# Patient Record
Sex: Female | Born: 1990 | Race: Black or African American | Hispanic: No | Marital: Single | Smoking: Never smoker
Health system: Southern US, Community
[De-identification: ages and names within clinical notes are randomized; demographics above are authoritative.]

## PROBLEM LIST (undated history)

## (undated) ENCOUNTER — Inpatient Hospital Stay (EMERGENCY_DEPARTMENT_HOSPITAL): Payer: Medicaid Other

## (undated) ENCOUNTER — Inpatient Hospital Stay (HOSPITAL_COMMUNITY): Payer: Self-pay

## (undated) DIAGNOSIS — A749 Chlamydial infection, unspecified: Secondary | ICD-10-CM

## (undated) DIAGNOSIS — R87629 Unspecified abnormal cytological findings in specimens from vagina: Secondary | ICD-10-CM

## (undated) DIAGNOSIS — I10 Essential (primary) hypertension: Secondary | ICD-10-CM

## (undated) DIAGNOSIS — U071 COVID-19: Secondary | ICD-10-CM

---
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
## 2013-08-22 ENCOUNTER — Inpatient Hospital Stay (HOSPITAL_COMMUNITY)
Admission: AD | Admit: 2013-08-22 | Discharge: 2013-08-22 | Disposition: A | Discharge status: Home / Self Care | Payer: Self-pay | Source: Ambulatory Visit | Attending: Family Medicine | Admitting: Family Medicine

## 2013-08-22 ENCOUNTER — Encounter (HOSPITAL_COMMUNITY): Payer: Self-pay | Admitting: *Deleted

## 2013-08-22 ENCOUNTER — Inpatient Hospital Stay (HOSPITAL_COMMUNITY): Payer: Medicaid Other

## 2013-08-22 DIAGNOSIS — R109 Unspecified abdominal pain: Secondary | ICD-10-CM | POA: Insufficient documentation

## 2013-08-22 DIAGNOSIS — O926 Galactorrhea: Secondary | ICD-10-CM | POA: Insufficient documentation

## 2013-08-22 DIAGNOSIS — O0930 Supervision of pregnancy with insufficient antenatal care, unspecified trimester: Secondary | ICD-10-CM | POA: Insufficient documentation

## 2013-08-22 DIAGNOSIS — Z3201 Encounter for pregnancy test, result positive: Secondary | ICD-10-CM | POA: Insufficient documentation

## 2013-08-22 DIAGNOSIS — K5900 Constipation, unspecified: Secondary | ICD-10-CM | POA: Insufficient documentation

## 2013-08-22 DIAGNOSIS — N643 Galactorrhea not associated with childbirth: Secondary | ICD-10-CM

## 2013-08-22 LAB — CBC
HEMATOCRIT: 34.4 % — AB (ref 36.0–46.0)
Hemoglobin: 11.4 g/dL — ABNORMAL LOW (ref 12.0–15.0)
MCH: 29.6 pg (ref 26.0–34.0)
MCHC: 33.1 g/dL (ref 30.0–36.0)
MCV: 89.4 fL (ref 78.0–100.0)
PLATELETS: 159 10*3/uL (ref 150–400)
RBC: 3.85 MIL/uL — ABNORMAL LOW (ref 3.87–5.11)
RDW: 13.7 % (ref 11.5–15.5)
WBC: 6.9 10*3/uL (ref 4.0–10.5)

## 2013-08-22 LAB — DIFFERENTIAL
BASOS PCT: 0 % (ref 0–1)
Basophils Absolute: 0 10*3/uL (ref 0.0–0.1)
Eosinophils Absolute: 0 10*3/uL (ref 0.0–0.7)
Eosinophils Relative: 0 % (ref 0–5)
LYMPHS PCT: 24 % (ref 12–46)
Lymphs Abs: 1.7 10*3/uL (ref 0.7–4.0)
Monocytes Absolute: 0.4 10*3/uL (ref 0.1–1.0)
Monocytes Relative: 5 % (ref 3–12)
NEUTROS PCT: 71 % (ref 43–77)
Neutro Abs: 4.9 10*3/uL (ref 1.7–7.7)

## 2013-08-22 LAB — HEPATITIS B SURFACE ANTIGEN: HEP B S AG: NEGATIVE

## 2013-08-22 LAB — URINALYSIS, ROUTINE W REFLEX MICROSCOPIC
Bilirubin Urine: NEGATIVE
Glucose, UA: NEGATIVE mg/dL
Hgb urine dipstick: NEGATIVE
Ketones, ur: 15 mg/dL — AB
Nitrite: NEGATIVE
Protein, ur: NEGATIVE mg/dL
Specific Gravity, Urine: 1.02 (ref 1.005–1.030)
Urobilinogen, UA: 0.2 mg/dL (ref 0.0–1.0)
pH: 7 (ref 5.0–8.0)

## 2013-08-22 LAB — WET PREP, GENITAL
Clue Cells Wet Prep HPF POC: NONE SEEN
TRICH WET PREP: NONE SEEN
YEAST WET PREP: NONE SEEN

## 2013-08-22 LAB — URINE MICROSCOPIC-ADD ON

## 2013-08-22 LAB — OB RESULTS CONSOLE HIV ANTIBODY (ROUTINE TESTING): HIV: NONREACTIVE

## 2013-08-22 LAB — POCT PREGNANCY, URINE: Preg Test, Ur: POSITIVE — AB

## 2013-08-22 LAB — RAPID HIV SCREEN (WH-MAU): SUDS RAPID HIV SCREEN: NONREACTIVE

## 2013-08-22 LAB — RPR

## 2013-08-22 LAB — ABO/RH: ABO/RH(D): O POS

## 2013-08-22 NOTE — MAU Note (Addendum)
Been pretty sick last few wks, no longer nauseated.  Hasn't had a period since Sept, hx of irregular cycles.  Breasts started leaking- drips here and there past couple wks.  +HPT x2 yesterday.has been really constipated- giving her stomach cramps

## 2013-08-22 NOTE — MAU Provider Note (Signed)
History     CSN: [id number]  Arrival date and time: 08/22/13 1401   First Provider Initiated Contact with Patient 08/22/13 1550      Chief Complaint  Patient presents with  . Possible Pregnancy  . Abdominal Pain  . Breast Discharge    HPI  Monica Harding is a 23 y.o. female G1P0 unknown gestation who presents with breast leaking. She has noticed on occasion that both breast leak milk.  The patient took a pregnancy test yesterday and it was positive; she is pretty certain of her LMP which would make her approximately 34 weeks. The patient has no prenatal care. Following her positive pregnancy test the patient called the clinic and is scheduled for this Thursday.  She denies pain or bleeding at this time. At times she feels fetal movement; "feels like flutters".   OB History   Grav Para Term Preterm Abortions TAB SAB Ect Mult Living   1               History reviewed. No pertinent past medical history.  History reviewed. No pertinent past surgical history.  History reviewed. No pertinent family history.  History  Substance Use Topics  . Smoking status: Never Smoker   . Smokeless tobacco: Never Used  . Alcohol Use: Yes     Comment: social    Allergies: No Known Allergies  Prescriptions prior to admission  Medication Sig Dispense Refill  . acetaminophen (TYLENOL) 500 MG tablet Take 1,000 mg by mouth every 6 (six) hours as needed for headache.      . bisacodyl (DULCOLAX) 5 MG EC tablet Take 10 mg by mouth daily as needed for moderate constipation.      . calcium carbonate (TUMS - DOSED IN MG ELEMENTAL CALCIUM) 500 MG chewable tablet Chew 1 tablet by mouth daily as needed for heartburn.      . Calcium Carbonate Antacid (ROLAIDS EXTRA STRENGTH PO) Take 1 tablet by mouth daily as needed (used for heartburn).       Results for orders placed during the hospital encounter of 08/22/13 (from the past 48 hour(s))  URINALYSIS, ROUTINE W REFLEX MICROSCOPIC     Status:  Abnormal   Collection Time    08/22/13  2:25 PM      Result Value Ref Range   Color, Urine YELLOW  YELLOW   APPearance CLEAR  CLEAR   Specific Gravity, Urine 1.020  1.005 - 1.030   pH 7.0  5.0 - 8.0   Glucose, UA NEGATIVE  NEGATIVE mg/dL   Hgb urine dipstick NEGATIVE  NEGATIVE   Bilirubin Urine NEGATIVE  NEGATIVE   Ketones, ur 15 (*) NEGATIVE mg/dL   Protein, ur NEGATIVE  NEGATIVE mg/dL   Urobilinogen, UA 0.2  0.0 - 1.0 mg/dL   Nitrite NEGATIVE  NEGATIVE   Leukocytes, UA TRACE (*) NEGATIVE  URINE MICROSCOPIC-ADD ON     Status: Abnormal   Collection Time    08/22/13  2:25 PM      Result Value Ref Range   Squamous Epithelial / LPF FEW (*) RARE   WBC, UA 0-2  <3 WBC/hpf   Bacteria, UA FEW (*) RARE   Urine-Other MUCOUS PRESENT    POCT PREGNANCY, URINE     Status: Abnormal   Collection Time    08/22/13  2:28 PM      Result Value Ref Range   Preg Test, Ur POSITIVE (*) NEGATIVE   Comment:  THE SENSITIVITY OF THIS     METHODOLOGY IS >24 mIU/mL    Review of Systems  Constitutional: Negative for fever and chills.  Gastrointestinal: Positive for constipation (Small BM's daily ). Negative for nausea, vomiting, abdominal pain and diarrhea.  Genitourinary: Negative for dysuria, urgency, frequency and hematuria.       No vaginal discharge. No vaginal bleeding. No dysuria.    Physical Exam   Blood pressure 115/82, pulse 127, temperature 98.5 F (36.9 C), temperature source Oral, resp. rate 18, height 5\' 6"  (1.676 m), weight 120.657 kg (266 lb), last menstrual period 12/27/2012.  Physical Exam  Constitutional: She is oriented to person, place, and time. She appears well-developed and well-nourished.  Non-toxic appearance. She does not have a sickly appearance. She does not appear ill. No distress.  HENT:  Head: Normocephalic.  Eyes: Pupils are equal, round, and reactive to light.  Neck: Neck supple.  Respiratory: Effort normal. Right breast exhibits no nipple discharge.  Left breast exhibits no nipple discharge.  GI: Soft. She exhibits no distension. There is no tenderness. There is no rebound and no guarding.  Fundal Height 22-28 weeks   Genitourinary:  Speculum exam: Vagina - Small amount of creamy discharge, no odor Cervix - No contact bleeding GC/Chlam, wet prep done Chaperone present for exam.   Musculoskeletal: Normal range of motion.  Neurological: She is alert and oriented to person, place, and time.  Skin: Skin is warm. She is not diaphoretic.  Psychiatric: Her behavior is normal.    MAU Course  Procedures None  MDM +fht in MAU  Pt is [id number] based on US 3.58 cm cervical length on preliminary report Pt is scheduled in the clinic on Thursday and encouraged to keep that appointment  Prenatal labs drawn Wet pre GC-pending UA   Assessment and Plan   A:  1. Galactorrhea   2. Encounter for pregnancy test, result positive   3. No prenatal care in current pregnancy    P:  Discharge home in stable condition Return to MAU with any pain or bleeding Prenatal vitamins daily  Keep clinic appointment on Thursday Increase PO water intake   Iona HansenJennifer Irene Rasch, NP  08/22/2013, 8:02 PM

## 2013-08-22 NOTE — MAU Provider Note (Signed)
Attestation of Attending Supervision of Advanced Practitioner (PA/CNM/NP): Evaluation and management procedures were performed by the Advanced Practitioner under my supervision and collaboration.  I have reviewed the Advanced Practitioner's note and chart, and I agree with the management and plan.  Shamal Stracener S Tonishia Steffy, MD [facility] Attending 08/22/2013 9:08 PM   

## 2013-08-23 LAB — GC/CHLAMYDIA PROBE AMP
CT PROBE, AMP APTIMA: NEGATIVE
GC Probe RNA: NEGATIVE

## 2013-08-23 LAB — RUBELLA SCREEN: RUBELLA: 3.02 {index} — AB (ref <0.90)

## 2013-08-24 ENCOUNTER — Ambulatory Visit (INDEPENDENT_AMBULATORY_CARE_PROVIDER_SITE_OTHER): Payer: Self-pay

## 2013-08-24 DIAGNOSIS — N926 Irregular menstruation, unspecified: Secondary | ICD-10-CM

## 2013-08-24 LAB — POCT PREGNANCY, URINE: Preg Test, Ur: POSITIVE — AB

## 2013-08-24 NOTE — Progress Notes (Signed)
Patient had scheduled appointment for pregnancy test today. Had been seen in MAU 08/22/13 and pregnancy was confirmed by ultrasound. Patient does not need today's appointment or pregnancy test. All lab work is up to date. New OB appointment scheduled for 09/11/13.

## 2013-09-11 ENCOUNTER — Ambulatory Visit (INDEPENDENT_AMBULATORY_CARE_PROVIDER_SITE_OTHER): Payer: Self-pay | Admitting: Obstetrics and Gynecology

## 2013-09-11 ENCOUNTER — Encounter: Payer: Self-pay | Admitting: Obstetrics and Gynecology

## 2013-09-11 VITALS — BP 128/77 | HR 96 | Temp 98.2°F | Wt 269.8 lb

## 2013-09-11 DIAGNOSIS — O0933 Supervision of pregnancy with insufficient antenatal care, third trimester: Secondary | ICD-10-CM

## 2013-09-11 DIAGNOSIS — O0930 Supervision of pregnancy with insufficient antenatal care, unspecified trimester: Secondary | ICD-10-CM

## 2013-09-11 DIAGNOSIS — Z23 Encounter for immunization: Secondary | ICD-10-CM

## 2013-09-11 DIAGNOSIS — O99210 Obesity complicating pregnancy, unspecified trimester: Secondary | ICD-10-CM | POA: Insufficient documentation

## 2013-09-11 DIAGNOSIS — E669 Obesity, unspecified: Secondary | ICD-10-CM

## 2013-09-11 LAB — CBC
HEMATOCRIT: 33.7 % — AB (ref 36.0–46.0)
HEMOGLOBIN: 11.2 g/dL — AB (ref 12.0–15.0)
MCH: 28.6 pg (ref 26.0–34.0)
MCHC: 33.2 g/dL (ref 30.0–36.0)
MCV: 86 fL (ref 78.0–100.0)
Platelets: 178 10*3/uL (ref 150–400)
RBC: 3.92 MIL/uL (ref 3.87–5.11)
RDW: 14.5 % (ref 11.5–15.5)
WBC: 6.8 10*3/uL (ref 4.0–10.5)

## 2013-09-11 LAB — POCT URINALYSIS DIP (DEVICE)
Bilirubin Urine: NEGATIVE
Glucose, UA: NEGATIVE mg/dL
HGB URINE DIPSTICK: NEGATIVE
Ketones, ur: NEGATIVE mg/dL
Nitrite: NEGATIVE
PH: 5.5 (ref 5.0–8.0)
Protein, ur: NEGATIVE mg/dL
SPECIFIC GRAVITY, URINE: 1.025 (ref 1.005–1.030)
Urobilinogen, UA: 1 mg/dL (ref 0.0–1.0)

## 2013-09-11 MED ORDER — TETANUS-DIPHTH-ACELL PERTUSSIS 5-2.5-18.5 LF-MCG/0.5 IM SUSP: 0.5000 mL | Freq: Once | INTRAMUSCULAR | Status: DC

## 2013-09-11 MED ORDER — PANTOPRAZOLE SODIUM 40 MG PO TBEC: 40.0000 mg | DELAYED_RELEASE_TABLET | Freq: Every day | ORAL | Status: DC

## 2013-09-11 NOTE — Patient Instructions (Signed)
Third Trimester of Pregnancy  The third trimester is from week 29 through week 42, months 7 through 9. The third trimester is a time when the fetus is growing rapidly. At the end of the ninth month, the fetus is about 20 inches in length and weighs 6 10 pounds.   BODY CHANGES  Your body goes through many changes during pregnancy. The changes vary from woman to woman.    Your weight will continue to increase. You can expect to gain 25 35 pounds (11 16 kg) by the end of the pregnancy.   You may begin to get stretch marks on your hips, abdomen, and breasts.   You may urinate more often because the fetus is moving lower into your pelvis and pressing on your bladder.   You may develop or continue to have heartburn as a result of your pregnancy.   You may develop constipation because certain hormones are causing the muscles that push waste through your intestines to slow down.   You may develop hemorrhoids or swollen, bulging veins (varicose veins).   You may have pelvic pain because of the weight gain and pregnancy hormones relaxing your joints between the bones in your pelvis. Back aches may result from over exertion of the muscles supporting your posture.   Your breasts will continue to grow and be tender. A yellow discharge may leak from your breasts called colostrum.   Your belly button may stick out.   You may feel short of breath because of your expanding uterus.   You may notice the fetus "dropping," or moving lower in your abdomen.   You may have a bloody mucus discharge. This usually occurs a few days to a week before labor begins.   Your cervix becomes thin and soft (effaced) near your due date.  WHAT TO EXPECT AT YOUR PRENATAL EXAMS   You will have prenatal exams every 2 weeks until week 36. Then, you will have weekly prenatal exams. During a routine prenatal visit:   You will be weighed to make sure you and the fetus are growing normally.   Your blood pressure is taken.   Your abdomen will be  measured to track your baby's growth.   The fetal heartbeat will be listened to.   Any test results from the previous visit will be discussed.   You may have a cervical check near your due date to see if you have effaced.  At around 36 weeks, your caregiver will check your cervix. At the same time, your caregiver will also perform a test on the secretions of the vaginal tissue. This test is to determine if a type of bacteria, Group B streptococcus, is present. Your caregiver will explain this further.  Your caregiver may ask you:   What your birth plan is.   How you are feeling.   If you are feeling the baby move.   If you have had any abnormal symptoms, such as leaking fluid, bleeding, severe headaches, or abdominal cramping.   If you have any questions.  Other tests or screenings that may be performed during your third trimester include:   Blood tests that check for low iron levels (anemia).   Fetal testing to check the health, activity level, and growth of the fetus. Testing is done if you have certain medical conditions or if there are problems during the pregnancy.  FALSE LABOR  You may feel small, irregular contractions that eventually go away. These are called Braxton Hicks contractions, or   false labor. Contractions may last for hours, days, or even weeks before true labor sets in. If contractions come at regular intervals, intensify, or become painful, it is best to be seen by your caregiver.   SIGNS OF LABOR    Menstrual-like cramps.   Contractions that are 5 minutes apart or less.   Contractions that start on the top of the uterus and spread down to the lower abdomen and back.   A sense of increased pelvic pressure or back pain.   A watery or bloody mucus discharge that comes from the vagina.  If you have any of these signs before the 37th week of pregnancy, call your caregiver right away. You need to go to the hospital to get checked immediately.  HOME CARE INSTRUCTIONS    Avoid all  smoking, herbs, alcohol, and unprescribed drugs. These chemicals affect the formation and growth of the baby.   Follow your caregiver's instructions regarding medicine use. There are medicines that are either safe or unsafe to take during pregnancy.   Exercise only as directed by your caregiver. Experiencing uterine cramps is a good sign to stop exercising.   Continue to eat regular, healthy meals.   Wear a good support bra for breast tenderness.   Do not use hot tubs, steam rooms, or saunas.   Wear your seat belt at all times when driving.   Avoid raw meat, uncooked cheese, cat litter boxes, and soil used by cats. These carry germs that can cause birth defects in the baby.   Take your prenatal vitamins.   Try taking a stool softener (if your caregiver approves) if you develop constipation. Eat more high-fiber foods, such as fresh vegetables or fruit and whole grains. Drink plenty of fluids to keep your urine clear or pale yellow.   Take warm sitz baths to soothe any pain or discomfort caused by hemorrhoids. Use hemorrhoid cream if your caregiver approves.   If you develop varicose veins, wear support hose. Elevate your feet for 15 minutes, 3 4 times a day. Limit salt in your diet.   Avoid heavy lifting, wear low heal shoes, and practice good posture.   Rest a lot with your legs elevated if you have leg cramps or low back pain.   Visit your dentist if you have not gone during your pregnancy. Use a soft toothbrush to brush your teeth and be gentle when you floss.   A sexual relationship may be continued unless your caregiver directs you otherwise.   Do not travel far distances unless it is absolutely necessary and only with the approval of your caregiver.   Take prenatal classes to understand, practice, and ask questions about the labor and delivery.   Make a trial run to the hospital.   Pack your hospital bag.   Prepare the baby's nursery.   Continue to go to all your prenatal visits as directed  by your caregiver.  SEEK MEDICAL CARE IF:   You are unsure if you are in labor or if your water has broken.   You have dizziness.   You have mild pelvic cramps, pelvic pressure, or nagging pain in your abdominal area.   You have persistent nausea, vomiting, or diarrhea.   You have a bad smelling vaginal discharge.   You have pain with urination.  SEEK IMMEDIATE MEDICAL CARE IF:    You have a fever.   You are leaking fluid from your vagina.   You have spotting or bleeding from your vagina.     You have severe abdominal cramping or pain.   You have rapid weight loss or gain.   You have shortness of breath with chest pain.   You notice sudden or extreme swelling of your face, hands, ankles, feet, or legs.   You have not felt your baby move in over an hour.   You have severe headaches that do not go away with medicine.   You have vision changes.  Document Released: 03/17/2001 Document Revised: 11/23/2012 Document Reviewed: 05/24/2012  [employer] Patient Information 2014 [employer].

## 2013-09-11 NOTE — Progress Notes (Signed)
   Subjective:    Monica Harding is a G1P0 [redacted]w[redacted]d being seen today for her first obstetrical visit.  Her obstetrical history is significant for G1, unaware of pregnancy before 25 week, late [facility]. Patient does intend to breast feed. Pregnancy history fully reviewed.  Patient reports heartburn unresponsive to Rolaids.Ceasar Mons Vitals:   09/11/13 1358  BP: 128/77  Pulse: 96    HISTORY: OB History  Gravida Para Term Preterm AB SAB TAB Ectopic Multiple Living  1             # Outcome Date GA Lbr Len/2nd Weight Sex Delivery Anes PTL Lv  1 CUR              History reviewed. No pertinent past medical history. History reviewed. No pertinent past surgical history. History reviewed. No pertinent family history.   Exam    Uterus:     Pelvic Exam:    Perineum: Normal Perineum   Vulva: normal, Bartholin's, Urethra, Skene's normal   Vagina:  normal mucosa, normal discharge       Cervix: no bleeding following Pap and nulliparous appearance   Adnexa: not evaluated   Bony Pelvis: average  System: Breast:  normal appearance, no masses or tenderness   Skin: normal coloration and turgor, no rashes    Neurologic: oriented, grossly non-focal   Extremities: normal strength, tone, and muscle mass   HEENT PERRLA, extra ocular movement intact and thyroid without masses   Mouth/Teeth mucous membranes moist, pharynx normal without lesions and dental hygiene good   Neck supple and no masses   Cardiovascular: regular rate and rhythm, no murmurs or gallops   Respiratory:  appears well, vitals normal, no respiratory distress, acyanotic, normal RR, ear and throat exam is normal, neck free of mass or lymphadenopathy, chest clear, no wheezing, crepitations, rhonchi, normal symmetric air entry   Abdomen: soft, non-tender; bowel sounds normal; no masses,  no organomegaly   Urinary: urethral meatus normal      Assessment:    Pregnancy: G1P0 Patient Active Problem List   Diagnosis Date Noted  .  Supervision of high-risk pregnancy with insufficient prenatal care in third trimester 09/11/2013        Plan:     Initial labs drawn. Prenatal vitamins. Problem list reviewed and updated. Genetic Screening discussed Quad Screen: too late.  Ultrasound discussed; fetal survey: ordered.  Follow up in 2 weeks. 50% of 30 min visit spent on counseling and coordination of care.  Discussed diet, visit schedule, classes, danger signs.    Jennylee Uehara Colin Mulders 09/11/2013

## 2013-09-11 NOTE — Addendum Note (Signed)
Addended by: Kathee Delton on: 09/11/2013 03:09 PM   Modules accepted: Orders

## 2013-09-11 NOTE — Progress Notes (Signed)
Reports frequent heartburn and states rolaids does not help.  Edema in feet. C/o of intermittent lower back pain. Discussed appropriate weight gain (11-20lb); patient verbalized understanding.  New OB packets given.  1hr gtt and labs today as well as pap and cultures.

## 2013-09-12 LAB — GLUCOSE TOLERANCE, 1 HOUR (50G) W/O FASTING: Glucose, 1 Hour GTT: 106 mg/dL (ref 70–140)

## 2013-09-13 LAB — PRESCRIPTION MONITORING PROFILE (19 PANEL)
Amphetamine/Meth: NEGATIVE ng/mL
Barbiturate Screen, Urine: NEGATIVE ng/mL
Benzodiazepine Screen, Urine: NEGATIVE ng/mL
Buprenorphine, Urine: NEGATIVE ng/mL
COCAINE METABOLITES: NEGATIVE ng/mL
CREATININE, URINE: 189.58 mg/dL (ref >20.0)
Cannabinoid Scrn, Ur: NEGATIVE ng/mL
Carisoprodol, Urine: NEGATIVE ng/mL
ECSTASY: NEGATIVE ng/mL
FENTANYL URINE: NEGATIVE ng/mL
MEPERIDINE UR: NEGATIVE ng/mL
Methadone Screen, Urine: NEGATIVE ng/mL
Methaqualone: NEGATIVE ng/mL
Nitrites, Initial: NEGATIVE ug/mL
OXYCODONE SCRN UR: NEGATIVE ng/mL
Opiate Screen, Urine: NEGATIVE ng/mL
PH URINE, INITIAL: 6.5 pH (ref 4.5–8.9)
Phencyclidine, Ur: NEGATIVE ng/mL
Propoxyphene: NEGATIVE ng/mL
TRAMADOL UR: NEGATIVE ng/mL
Tapentadol, urine: NEGATIVE ng/mL
ZOLPIDEM, URINE: NEGATIVE ng/mL

## 2013-09-13 LAB — CULTURE, OB URINE

## 2013-09-13 LAB — CYTOLOGY - PAP

## 2013-09-22 ENCOUNTER — Ambulatory Visit (HOSPITAL_COMMUNITY)
Admission: RE | Admit: 2013-09-22 | Discharge: 2013-09-22 | Disposition: A | Discharge status: Home / Self Care | Payer: Medicaid Other | Source: Ambulatory Visit | Attending: Obstetrics and Gynecology | Admitting: Obstetrics and Gynecology

## 2013-09-22 DIAGNOSIS — O99210 Obesity complicating pregnancy, unspecified trimester: Secondary | ICD-10-CM | POA: Diagnosis present

## 2013-09-22 DIAGNOSIS — R9389 Abnormal findings on diagnostic imaging of other specified body structures: Secondary | ICD-10-CM

## 2013-09-22 DIAGNOSIS — Z3689 Encounter for other specified antenatal screening: Secondary | ICD-10-CM | POA: Diagnosis not present

## 2013-09-22 DIAGNOSIS — O0930 Supervision of pregnancy with insufficient antenatal care, unspecified trimester: Secondary | ICD-10-CM | POA: Diagnosis not present

## 2013-09-22 DIAGNOSIS — O0933 Supervision of pregnancy with insufficient antenatal care, third trimester: Secondary | ICD-10-CM

## 2013-09-22 DIAGNOSIS — E669 Obesity, unspecified: Secondary | ICD-10-CM | POA: Diagnosis not present

## 2013-09-27 ENCOUNTER — Ambulatory Visit (INDEPENDENT_AMBULATORY_CARE_PROVIDER_SITE_OTHER): Payer: Self-pay | Admitting: Advanced Practice Midwife

## 2013-09-27 VITALS — BP 136/89 | HR 100 | Temp 98.4°F | Wt 267.4 lb

## 2013-09-27 DIAGNOSIS — O0930 Supervision of pregnancy with insufficient antenatal care, unspecified trimester: Secondary | ICD-10-CM

## 2013-09-27 DIAGNOSIS — O0933 Supervision of pregnancy with insufficient antenatal care, third trimester: Secondary | ICD-10-CM

## 2013-09-27 LAB — POCT URINALYSIS DIP (DEVICE)
BILIRUBIN URINE: NEGATIVE
GLUCOSE, UA: NEGATIVE mg/dL
KETONES UR: NEGATIVE mg/dL
Nitrite: NEGATIVE
Protein, ur: NEGATIVE mg/dL
SPECIFIC GRAVITY, URINE: 1.025 (ref 1.005–1.030)
Urobilinogen, UA: 0.2 mg/dL (ref 0.0–1.0)
pH: 6 (ref 5.0–8.0)

## 2013-09-27 MED ORDER — DOCUSATE SODIUM 100 MG PO CAPS: 100.0000 mg | ORAL_CAPSULE | Freq: Two times a day (BID) | ORAL | Status: DC | PRN

## 2013-09-27 NOTE — Patient Instructions (Signed)
High-Fiber Diet Fiber is found in fruits, vegetables, and grains. A high-fiber diet encourages the addition of more whole grains, legumes, fruits, and vegetables in your diet. The recommended amount of fiber for adult males is 38 g per day. For adult females, it is 25 g per day. Pregnant and lactating women should get 28 g of fiber per day. If you have a digestive or bowel problem, ask your caregiver for advice before adding high-fiber foods to your diet. Eat a variety of high-fiber foods instead of only a select few type of foods.  PURPOSE  To increase stool bulk.  To make bowel movements more regular to prevent constipation.  To lower cholesterol.  To prevent overeating. WHEN IS THIS DIET USED?  It may be used if you have constipation and hemorrhoids.  It may be used if you have uncomplicated diverticulosis (intestine condition) and irritable bowel syndrome.  It may be used if you need help with weight management.  It may be used if you want to add it to your diet as a protective measure against atherosclerosis, diabetes, and cancer. SOURCES OF FIBER  Whole-grain breads and cereals.  Fruits, such as apples, oranges, bananas, berries, prunes, and pears.  Vegetables, such as green peas, carrots, sweet potatoes, beets, broccoli, cabbage, spinach, and artichokes.  Legumes, such split peas, soy, lentils.  Almonds. FIBER CONTENT IN FOODS Starches and Grains / Dietary Fiber (g)  Raisin Bran, 1 cup/7 g  Cheerios, 1 cup / 3 g  Corn Flakes cereal, 1 cup / 0.7 g  Rice crispy treat cereal, 1 cup / 0.3 g  Instant oatmeal (cooked),  cup / 2 g  Frosted wheat cereal, 1 cup / 5.1 g  Brown, long-grain rice (cooked), 1 cup / 3.5 g  White, long-grain rice (cooked), 1 cup / 0.6 g  Enriched macaroni (cooked), 1 cup / 2.5 g Legumes / Dietary Fiber (g)  Baked beans (canned, plain, or vegetarian),  cup / 5.2 g  Kidney beans (canned),  cup / 6.8 g  Pinto beans (cooked),  cup /  5.5 g Breads and Crackers / Dietary Fiber (g)  Plain or honey graham crackers, 2 squares / 0.7 g  Saltine crackers, 3 squares / 0.3 g  Plain, salted pretzels, 10 pieces / 1.8 g  Whole-wheat bread, 1 slice / 1.9 g  White bread, 1 slice / 0.7 g  Raisin bread, 1 slice / 1.2 g  Plain bagel, 3 oz / 2 g  Flour tortilla, 1 oz / 0.9 g  Corn tortilla, 1 small / 1.5 g  Hamburger or hotdog bun, 1 small / 0.9 g Fruits / Dietary Fiber (g)  Apple with skin, 1 medium / 4.4 g  Sweetened applesauce,  cup / 1.5 g  Banana,  medium / 1.5 g  Grapes, 10 grapes / 0.4 g  Orange, 1 small / 2.3 g  Raisin, 1.5 oz / 1.6 g  Melon, 1 cup / 1.4 g Vegetables / Dietary Fiber (g)  Green beans (canned),  cup / 1.3 g  Carrots (cooked),  cup / 2.3 g  Broccoli (cooked),  cup / 2.8 g  Peas (cooked),  cup / 4.4 g  Mashed potatoes,  cup / 1.6 g  Lettuce, 1 cup / 0.5 g  Corn (canned),  cup / 1.6 g  Tomato,  cup / 1.1 g Document Released: 03/23/2005 Document Revised: 09/22/2011 Document Reviewed: 06/25/2011 Exit[employer] Patient Information 2015 [employer]. This information is not intended to replace advice given to  you by your health care Jaiel Saraceno. Make sure you discuss any questions you have with your health care Berwyn Bigley.  

## 2013-09-27 NOTE — Progress Notes (Signed)
Reports intermittent lower abdominal/pelvic/back pain.

## 2013-09-27 NOTE — Progress Notes (Signed)
Doing well.  Good fetal movement, denies vaginal bleeding, LOF, regular contractions.  Reports uncomfortable cramping 1-2x/day.  Also is having constipation/hemorrhoids.   Discussed increase water intake, high fiber diet.

## 2013-10-03 DIAGNOSIS — R9389 Abnormal findings on diagnostic imaging of other specified body structures: Secondary | ICD-10-CM | POA: Insufficient documentation

## 2013-10-13 ENCOUNTER — Encounter: Payer: Self-pay | Admitting: Obstetrics and Gynecology

## 2013-10-13 ENCOUNTER — Ambulatory Visit (INDEPENDENT_AMBULATORY_CARE_PROVIDER_SITE_OTHER): Payer: Medicaid Other | Admitting: Obstetrics and Gynecology

## 2013-10-13 VITALS — BP 99/68 | HR 86 | Wt 268.7 lb

## 2013-10-13 DIAGNOSIS — O0933 Supervision of pregnancy with insufficient antenatal care, third trimester: Secondary | ICD-10-CM

## 2013-10-13 DIAGNOSIS — O0930 Supervision of pregnancy with insufficient antenatal care, unspecified trimester: Secondary | ICD-10-CM

## 2013-10-13 NOTE — Progress Notes (Signed)
Patient reports that she is having contractions that wake her up out of her sleep.  Also reports having headaches.

## 2013-10-13 NOTE — Progress Notes (Signed)
Constant H/As throughout head, some relief w/ Tylenol 1000mg  per day. Stress with job and moving again tomorrow, not sleeping well. Advised relaxation and increase daily amount Tylenol.  Woke from sleep with painful UCs intermittently and some other similar episodes.  Scheduled F/U US interval growth.

## 2013-10-13 NOTE — Patient Instructions (Signed)

## 2013-10-16 ENCOUNTER — Other Ambulatory Visit: Payer: Self-pay | Admitting: Obstetrics and Gynecology

## 2013-10-16 ENCOUNTER — Ambulatory Visit (HOSPITAL_COMMUNITY)
Admission: RE | Admit: 2013-10-16 | Discharge: 2013-10-16 | Disposition: A | Discharge status: Home / Self Care | Payer: Medicaid Other | Source: Ambulatory Visit | Attending: Obstetrics and Gynecology | Admitting: Obstetrics and Gynecology

## 2013-10-16 DIAGNOSIS — O0930 Supervision of pregnancy with insufficient antenatal care, unspecified trimester: Secondary | ICD-10-CM | POA: Diagnosis not present

## 2013-10-16 DIAGNOSIS — O0933 Supervision of pregnancy with insufficient antenatal care, third trimester: Secondary | ICD-10-CM

## 2013-10-16 DIAGNOSIS — Z3689 Encounter for other specified antenatal screening: Secondary | ICD-10-CM | POA: Diagnosis not present

## 2013-10-16 LAB — POCT URINALYSIS DIP (DEVICE)
BILIRUBIN URINE: NEGATIVE
GLUCOSE, UA: NEGATIVE mg/dL
Hgb urine dipstick: NEGATIVE
Leukocytes, UA: NEGATIVE
Nitrite: NEGATIVE
Protein, ur: NEGATIVE mg/dL
Urobilinogen, UA: 0.2 mg/dL (ref 0.0–1.0)
pH: 5.5 (ref 5.0–8.0)

## 2013-11-02 ENCOUNTER — Ambulatory Visit (INDEPENDENT_AMBULATORY_CARE_PROVIDER_SITE_OTHER): Payer: Medicaid Other | Admitting: Obstetrics and Gynecology

## 2013-11-02 VITALS — BP 119/66 | HR 133 | Temp 98.2°F | Wt 273.4 lb

## 2013-11-02 DIAGNOSIS — O0933 Supervision of pregnancy with insufficient antenatal care, third trimester: Secondary | ICD-10-CM

## 2013-11-02 DIAGNOSIS — O0930 Supervision of pregnancy with insufficient antenatal care, unspecified trimester: Secondary | ICD-10-CM

## 2013-11-02 DIAGNOSIS — Z872 Personal history of diseases of the skin and subcutaneous tissue: Secondary | ICD-10-CM | POA: Insufficient documentation

## 2013-11-02 LAB — POCT URINALYSIS DIP (DEVICE)
GLUCOSE, UA: NEGATIVE mg/dL
Hgb urine dipstick: NEGATIVE
KETONES UR: 40 mg/dL — AB
Leukocytes, UA: NEGATIVE
Nitrite: NEGATIVE
PROTEIN: NEGATIVE mg/dL
SPECIFIC GRAVITY, URINE: 1.025 (ref 1.005–1.030)
Urobilinogen, UA: 0.2 mg/dL (ref 0.0–1.0)
pH: 6 (ref 5.0–8.0)

## 2013-11-02 LAB — OB RESULTS CONSOLE GC/CHLAMYDIA
Chlamydia: NEGATIVE
GC PROBE AMP, GENITAL: NEGATIVE

## 2013-11-02 LAB — OB RESULTS CONSOLE GBS: STREP GROUP B AG: NEGATIVE

## 2013-11-02 NOTE — Progress Notes (Signed)
Pressure- vaginal   Pain-"feels like a contraction"

## 2013-11-02 NOTE — Patient Instructions (Signed)
Third Trimester of Pregnancy The third trimester is from week 29 through week 42, months 7 through 9. The third trimester is a time when the fetus is growing rapidly. At the end of the ninth month, the fetus is about 20 inches in length and weighs 6-10 pounds.  BODY CHANGES Your body goes through many changes during pregnancy. The changes vary from woman to woman.   Your weight will continue to increase. You can expect to gain 25-35 pounds (11-16 kg) by the end of the pregnancy.  You may begin to get stretch marks on your hips, abdomen, and breasts.  You may urinate more often because the fetus is moving lower into your pelvis and pressing on your bladder.  You may develop or continue to have heartburn as a result of your pregnancy.  You may develop constipation because certain hormones are causing the muscles that push waste through your intestines to slow down.  You may develop hemorrhoids or swollen, bulging veins (varicose veins).  You may have pelvic pain because of the weight gain and pregnancy hormones relaxing your joints between the bones in your pelvis. Backaches may result from overexertion of the muscles supporting your posture.  You may have changes in your hair. These can include thickening of your hair, rapid growth, and changes in texture. Some women also have hair loss during or after pregnancy, or hair that feels dry or thin. Your hair will most likely return to normal after your baby is born.  Your breasts will continue to grow and be tender. A yellow discharge may leak from your breasts called colostrum.  Your belly button may stick out.  You may feel short of breath because of your expanding uterus.  You may notice the fetus "dropping," or moving lower in your abdomen.  You may have a bloody mucus discharge. This usually occurs a few days to a week before labor begins.  Your cervix becomes thin and soft (effaced) near your due date. WHAT TO EXPECT AT YOUR PRENATAL  EXAMS  You will have prenatal exams every 2 weeks until week 36. Then, you will have weekly prenatal exams. During a routine prenatal visit:  You will be weighed to make sure you and the fetus are growing normally.  Your blood pressure is taken.  Your abdomen will be measured to track your baby's growth.  The fetal heartbeat will be listened to.  Any test results from the previous visit will be discussed.  You may have a cervical check near your due date to see if you have effaced. At around 36 weeks, your caregiver will check your cervix. At the same time, your caregiver will also perform a test on the secretions of the vaginal tissue. This test is to determine if a type of bacteria, Group B streptococcus, is present. Your caregiver will explain this further. Your caregiver may ask you:  What your birth plan is.  How you are feeling.  If you are feeling the baby move.  If you have had any abnormal symptoms, such as leaking fluid, bleeding, severe headaches, or abdominal cramping.  If you have any questions. Other tests or screenings that may be performed during your third trimester include:  Blood tests that check for low iron levels (anemia).  Fetal testing to check the health, activity level, and growth of the fetus. Testing is done if you have certain medical conditions or if there are problems during the pregnancy. FALSE LABOR You may feel small, irregular contractions that   eventually go away. These are called Braxton Hicks contractions, or false labor. Contractions may last for hours, days, or even weeks before true labor sets in. If contractions come at regular intervals, intensify, or become painful, it is best to be seen by your caregiver.  SIGNS OF LABOR   Menstrual-like cramps.  Contractions that are 5 minutes apart or less.  Contractions that start on the top of the uterus and spread down to the lower abdomen and back.  A sense of increased pelvic pressure or back  pain.  A watery or bloody mucus discharge that comes from the vagina. If you have any of these signs before the 37th week of pregnancy, call your caregiver right away. You need to go to the hospital to get checked immediately. HOME CARE INSTRUCTIONS   Avoid all smoking, herbs, alcohol, and unprescribed drugs. These chemicals affect the formation and growth of the baby.  Follow your caregiver's instructions regarding medicine use. There are medicines that are either safe or unsafe to take during pregnancy.  Exercise only as directed by your caregiver. Experiencing uterine cramps is a good sign to stop exercising.  Continue to eat regular, healthy meals.  Wear a good support bra for breast tenderness.  Do not use hot tubs, steam rooms, or saunas.  Wear your seat belt at all times when driving.  Avoid raw meat, uncooked cheese, cat litter boxes, and soil used by cats. These carry germs that can cause birth defects in the baby.  Take your prenatal vitamins.  Try taking a stool softener (if your caregiver approves) if you develop constipation. Eat more high-fiber foods, such as fresh vegetables or fruit and whole grains. Drink plenty of fluids to keep your urine clear or pale yellow.  Take warm sitz baths to soothe any pain or discomfort caused by hemorrhoids. Use hemorrhoid cream if your caregiver approves.  If you develop varicose veins, wear support hose. Elevate your feet for 15 minutes, 3-4 times a day. Limit salt in your diet.  Avoid heavy lifting, wear low heal shoes, and practice good posture.  Rest a lot with your legs elevated if you have leg cramps or low back pain.  Visit your dentist if you have not gone during your pregnancy. Use a soft toothbrush to brush your teeth and be gentle when you floss.  A sexual relationship may be continued unless your caregiver directs you otherwise.  Do not travel far distances unless it is absolutely necessary and only with the approval  of your caregiver.  Take prenatal classes to understand, practice, and ask questions about the labor and delivery.  Make a trial run to the hospital.  Pack your hospital bag.  Prepare the baby's nursery.  Continue to go to all your prenatal visits as directed by your caregiver. SEEK MEDICAL CARE IF:  You are unsure if you are in labor or if your water has broken.  You have dizziness.  You have mild pelvic cramps, pelvic pressure, or nagging pain in your abdominal area.  You have persistent nausea, vomiting, or diarrhea.  You have a bad smelling vaginal discharge.  You have pain with urination. SEEK IMMEDIATE MEDICAL CARE IF:   You have a fever.  You are leaking fluid from your vagina.  You have spotting or bleeding from your vagina.  You have severe abdominal cramping or pain.  You have rapid weight loss or gain.  You have shortness of breath with chest pain.  You notice sudden or extreme swelling   of your face, hands, ankles, feet, or legs.  You have not felt your baby move in over an hour.  You have severe headaches that do not go away with medicine.  You have vision changes. Document Released: 03/17/2001 Document Revised: 03/28/2013 Document Reviewed: 05/24/2012 [facility] Patient Information 2015 [place], [place]. This information is not intended to replace advice given to you by your health care provider. Make sure you discuss any questions you have with your health care provider. Heartburn Heartburn is a painful, burning sensation in the chest. It may feel worse in certain positions, such as lying down or bending over. It is caused by stomach acid backing up into the tube that carries food from the mouth down to the stomach (lower esophagus).  CAUSES   Large meals.  Certain foods and drinks.  Exercise.  Increased acid production.  Being overweight or obese.  Certain medicines. SYMPTOMS   Burning pain in the chest or lower throat.  Bitter taste in  the mouth.  Coughing. DIAGNOSIS  If the usual treatments for heartburn do not improve your symptoms, then tests may be done to see if there is another condition present. Possible tests may include:  X-rays.  Endoscopy. This is when a tube with a light and a camera on the end is used to examine the esophagus and the stomach.  A test to measure the amount of acid in the esophagus (pH test).  A test to see if the esophagus is working properly (esophageal manometry).  Blood, breath, or stool tests to check for bacteria that cause ulcers. TREATMENT   Your caregiver may tell you to use certain over-the-counter medicines (antacids, acid reducers) for mild heartburn.  Your caregiver may prescribe medicines to decrease the acid in your stomach or protect your stomach lining.  Your caregiver may recommend certain diet changes.  For severe cases, your caregiver may recommend that the head of your bed be elevated on blocks. (Sleeping with more pillows is not an effective treatment as it only changes the position of your head and does not improve the main problem of stomach acid refluxing into the esophagus.) HOME CARE INSTRUCTIONS   Take all medicines as directed by your caregiver.  Raise the head of your bed by putting blocks under the legs if instructed to by your caregiver.  Do not exercise right after eating.  Avoid eating 2 or 3 hours before bed. Do not lie down right after eating.  Eat small meals throughout the day instead of 3 large meals.  Stop smoking if you smoke.  Maintain a healthy weight.  Identify foods and beverages that make your symptoms worse and avoid them. Foods you may want to avoid include:  Peppers.  Chocolate.  High-fat foods, including fried foods.  Spicy foods.  Garlic and onions.  Citrus fruits, including oranges, grapefruit, lemons, and limes.  Food containing tomatoes or tomato products.  Mint.  Carbonated drinks, caffeinated drinks, and  alcohol.  Vinegar. SEEK IMMEDIATE MEDICAL CARE IF:  You have severe chest pain that goes down your arm or into your jaw or neck.  You feel sweaty, dizzy, or lightheaded.  You are short of breath.  You vomit blood.  You have difficulty or pain with swallowing.  You have bloody or black, tarry stools.  You have episodes of heartburn more than 3 times a week for more than 2 weeks. MAKE SURE YOU:  Understand these instructions.  Will watch your condition.  Will get help right away if you  are not doing well or get worse. Document Released: 08/09/2008 Document Revised: 06/15/2011 Document Reviewed: 09/07/2010 [facility] Patient Information 2015 [place], [place]. This information is not intended to replace advice given to you by your health care provider. Make sure you discuss any questions you have with your health care provider.

## 2013-11-02 NOTE — Progress Notes (Signed)
Repeat pulse 108. States tired and sometimes hot and SOB, esp after working third shift. Good FM. US 34th%ile, nl AFI; AC lag, F/U US scheduled. S/Sx labor reviewed.  Forms keloids

## 2013-11-03 LAB — GC/CHLAMYDIA PROBE AMP
CT PROBE, AMP APTIMA: NEGATIVE
GC PROBE AMP APTIMA: NEGATIVE

## 2013-11-04 LAB — CULTURE, BETA STREP (GROUP B ONLY)

## 2013-11-09 ENCOUNTER — Ambulatory Visit (INDEPENDENT_AMBULATORY_CARE_PROVIDER_SITE_OTHER): Payer: Medicaid Other | Admitting: Obstetrics and Gynecology

## 2013-11-09 ENCOUNTER — Encounter: Payer: Self-pay | Admitting: Obstetrics and Gynecology

## 2013-11-09 VITALS — BP 124/71 | HR 104 | Temp 98.0°F | Wt 270.7 lb

## 2013-11-09 DIAGNOSIS — O0933 Supervision of pregnancy with insufficient antenatal care, third trimester: Secondary | ICD-10-CM

## 2013-11-09 DIAGNOSIS — O0930 Supervision of pregnancy with insufficient antenatal care, unspecified trimester: Secondary | ICD-10-CM

## 2013-11-09 LAB — POCT URINALYSIS DIP (DEVICE)
Bilirubin Urine: NEGATIVE
GLUCOSE, UA: NEGATIVE mg/dL
Hgb urine dipstick: NEGATIVE
KETONES UR: NEGATIVE mg/dL
Leukocytes, UA: NEGATIVE
Nitrite: NEGATIVE
Protein, ur: NEGATIVE mg/dL
SPECIFIC GRAVITY, URINE: 1.02 (ref 1.005–1.030)
Urobilinogen, UA: 1 mg/dL (ref 0.0–1.0)
pH: 6 (ref 5.0–8.0)

## 2013-11-09 NOTE — Progress Notes (Signed)
Edema-feet   Pain-"crampy in stomach" Given pt info medication to take in pregnancy Pt states that she vomits a lot at night when she goes to bed

## 2013-11-09 NOTE — Patient Instructions (Signed)

## 2013-11-09 NOTE — Progress Notes (Signed)
US at 33 wks 38th, nl AFI, nl Doppplers> f/u growth is scheduled. Has nausea and occasional vomiting when overeats, then feels better. Recommend small frequenet meals, heartburn meds as needed. Has stopped working now.

## 2013-11-15 ENCOUNTER — Other Ambulatory Visit: Payer: Self-pay | Admitting: Obstetrics and Gynecology

## 2013-11-15 ENCOUNTER — Ambulatory Visit (INDEPENDENT_AMBULATORY_CARE_PROVIDER_SITE_OTHER): Payer: Medicaid Other | Admitting: Advanced Practice Midwife

## 2013-11-15 ENCOUNTER — Ambulatory Visit (HOSPITAL_COMMUNITY)
Admission: RE | Admit: 2013-11-15 | Discharge: 2013-11-15 | Disposition: A | Discharge status: Home / Self Care | Payer: Medicaid Other | Source: Ambulatory Visit | Attending: Obstetrics and Gynecology | Admitting: Obstetrics and Gynecology

## 2013-11-15 VITALS — BP 109/53 | HR 97 | Wt 270.6 lb

## 2013-11-15 DIAGNOSIS — O0930 Supervision of pregnancy with insufficient antenatal care, unspecified trimester: Secondary | ICD-10-CM

## 2013-11-15 DIAGNOSIS — R9389 Abnormal findings on diagnostic imaging of other specified body structures: Secondary | ICD-10-CM

## 2013-11-15 DIAGNOSIS — O365990 Maternal care for other known or suspected poor fetal growth, unspecified trimester, not applicable or unspecified: Secondary | ICD-10-CM | POA: Diagnosis not present

## 2013-11-15 DIAGNOSIS — O0933 Supervision of pregnancy with insufficient antenatal care, third trimester: Secondary | ICD-10-CM

## 2013-11-15 DIAGNOSIS — Z3689 Encounter for other specified antenatal screening: Secondary | ICD-10-CM | POA: Insufficient documentation

## 2013-11-15 DIAGNOSIS — Z364 Encounter for antenatal screening for fetal growth retardation: Secondary | ICD-10-CM

## 2013-11-15 LAB — POCT URINALYSIS DIP (DEVICE)
BILIRUBIN URINE: NEGATIVE
GLUCOSE, UA: NEGATIVE mg/dL
Hgb urine dipstick: NEGATIVE
KETONES UR: NEGATIVE mg/dL
LEUKOCYTES UA: NEGATIVE
NITRITE: NEGATIVE
PH: 6 (ref 5.0–8.0)
Protein, ur: NEGATIVE mg/dL
Specific Gravity, Urine: 1.015 (ref 1.005–1.030)
Urobilinogen, UA: 1 mg/dL (ref 0.0–1.0)

## 2013-11-15 NOTE — Progress Notes (Signed)
Having lots of braxton hicks contractions

## 2013-11-15 NOTE — Patient Instructions (Addendum)
Braxton Hicks Contractions Contractions of the uterus can occur throughout pregnancy. Contractions are not always a sign that you are in labor.  WHAT ARE BRAXTON HICKS CONTRACTIONS?  Contractions that occur before labor are called Braxton Hicks contractions, or false labor. Toward the end of pregnancy (32-34 weeks), these contractions can develop more often and may become more forceful. This is not true labor because these contractions do not result in opening (dilatation) and thinning of the cervix. They are sometimes difficult to tell apart from true labor because these contractions can be forceful and people have different pain tolerances. You should not feel embarrassed if you go to the hospital with false labor. Sometimes, the only way to tell if you are in true labor is for your health care provider to look for changes in the cervix. If there are no prenatal problems or other health problems associated with the pregnancy, it is completely safe to be sent home with false labor and await the onset of true labor. HOW CAN YOU TELL THE DIFFERENCE BETWEEN TRUE AND FALSE LABOR? False Labor  The contractions of false labor are usually shorter and not as hard as those of true labor.   The contractions are usually irregular.   The contractions are often felt in the front of the lower abdomen and in the groin.   The contractions may go away when you walk around or change positions while lying down.   The contractions get weaker and are shorter lasting as time goes on.   The contractions do not usually become progressively stronger, regular, and closer together as with true labor.  True Labor  Contractions in true labor last 30-70 seconds, become very regular, usually become more intense, and increase in frequency.   The contractions do not go away with walking.   The discomfort is usually felt in the top of the uterus and spreads to the lower abdomen and low back.   True labor can be  determined by your health care provider with an exam. This will show that the cervix is dilating and getting thinner.  WHAT TO REMEMBER  Keep up with your usual exercises and follow other instructions given by your health care provider.   Take medicines as directed by your health care provider.   Keep your regular prenatal appointments.   Eat and drink lightly if you think you are going into labor.   If Braxton Hicks contractions are making you uncomfortable:   Change your position from lying down or resting to walking, or from walking to resting.   Sit and rest in a tub of warm water.   Drink 2-3 glasses of water. Dehydration may cause these contractions.   Do slow and deep breathing several times an hour.  WHEN SHOULD I SEEK IMMEDIATE MEDICAL CARE? Seek immediate medical care if:  Your contractions become stronger, more regular, and closer together.   You have fluid leaking or gushing from your vagina.   You have a fever.   You pass blood-tinged mucus.   You have vaginal bleeding.   You have continuous abdominal pain.   You have low back pain that you never had before.   You feel your baby's head pushing down and causing pelvic pressure.   Your baby is not moving as much as it used to.  Document Released: 03/23/2005 Document Revised: 03/28/2013 Document Reviewed: 01/02/2013 ExitCare Patient Information 2015 [employer]. This information is not intended to replace advice given to you by your health care   provider. Make sure you discuss any questions you have with your health care provider.   Contraception Choices Contraception (birth control) is the use of any methods or devices to prevent pregnancy. Below are some methods to help avoid pregnancy. HORMONAL METHODS   Contraceptive implant. This is a thin, plastic tube containing progesterone hormone. It does not contain estrogen hormone. Your health care provider inserts the tube in the inner part  of the upper arm. The tube can remain in place for up to 3 years. After 3 years, the implant must be removed. The implant prevents the ovaries from releasing an egg (ovulation), thickens the cervical mucus to prevent sperm from entering the uterus, and thins the lining of the inside of the uterus.  Progesterone-only injections. These injections are given every 3 months by your health care provider to prevent pregnancy. This synthetic progesterone hormone stops the ovaries from releasing eggs. It also thickens cervical mucus and changes the uterine lining. This makes it harder for sperm to survive in the uterus.  Birth control pills. These pills contain estrogen and progesterone hormone. They work by preventing the ovaries from releasing eggs (ovulation). They also cause the cervical mucus to thicken, preventing the sperm from entering the uterus. Birth control pills are prescribed by a health care provider.Birth control pills can also be used to treat heavy periods.  Minipill. This type of birth control pill contains only the progesterone hormone. They are taken every day of each month and must be prescribed by your health care provider.  Birth control patch. The patch contains hormones similar to those in birth control pills. It must be changed once a week and is prescribed by a health care provider.  Vaginal ring. The ring contains hormones similar to those in birth control pills. It is left in the vagina for 3 weeks, removed for 1 week, and then a new one is put back in place. The patient must be comfortable inserting and removing the ring from the vagina.A health care provider's prescription is necessary.  Emergency contraception. Emergency contraceptives prevent pregnancy after unprotected sexual intercourse. This pill can be taken right after sex or up to 5 days after unprotected sex. It is most effective the sooner you take the pills after having sexual intercourse. Most emergency contraceptive  pills are available without a prescription. Check with your pharmacist. Do not use emergency contraception as your only form of birth control. BARRIER METHODS   Female condom. This is a thin sheath (latex or rubber) that is worn over the penis during sexual intercourse. It can be used with spermicide to increase effectiveness.  Female condom. This is a soft, loose-fitting sheath that is put into the vagina before sexual intercourse.  Diaphragm. This is a soft, latex, dome-shaped barrier that must be fitted by a health care provider. It is inserted into the vagina, along with a spermicidal jelly. It is inserted before intercourse. The diaphragm should be left in the vagina for 6 to 8 hours after intercourse.  Cervical cap. This is a round, soft, latex or plastic cup that fits over the cervix and must be fitted by a health care provider. The cap can be left in place for up to 48 hours after intercourse.  Sponge. This is a soft, circular piece of polyurethane foam. The sponge has spermicide in it. It is inserted into the vagina after wetting it and before sexual intercourse.  Spermicides. These are chemicals that kill or block sperm from entering the cervix and uterus.   They come in the form of creams, jellies, suppositories, foam, or tablets. They do not require a prescription. They are inserted into the vagina with an applicator before having sexual intercourse. The process must be repeated every time you have sexual intercourse. INTRAUTERINE CONTRACEPTION  Intrauterine device (IUD). This is a T-shaped device that is put in a woman's uterus during a menstrual period to prevent pregnancy. There are 2 types:  Copper IUD. This type of IUD is wrapped in copper wire and is placed inside the uterus. Copper makes the uterus and fallopian tubes produce a fluid that kills sperm. It can stay in place for 10 years.  Hormone IUD. This type of IUD contains the hormone progestin (synthetic progesterone). The  hormone thickens the cervical mucus and prevents sperm from entering the uterus, and it also thins the uterine lining to prevent implantation of a fertilized egg. The hormone can weaken or kill the sperm that get into the uterus. It can stay in place for 3-5 years, depending on which type of IUD is used. PERMANENT METHODS OF CONTRACEPTION  Female tubal ligation. This is when the woman's fallopian tubes are surgically sealed, tied, or blocked to prevent the egg from traveling to the uterus.  Hysteroscopic sterilization. This involves placing a small coil or insert into each fallopian tube. Your doctor uses a technique called hysteroscopy to do the procedure. The device causes scar tissue to form. This results in permanent blockage of the fallopian tubes, so the sperm cannot fertilize the egg. It takes about 3 months after the procedure for the tubes to become blocked. You must use another form of birth control for these 3 months.  Female sterilization. This is when the female has the tubes that carry sperm tied off (vasectomy).This blocks sperm from entering the vagina during sexual intercourse. After the procedure, the man can still ejaculate fluid (semen). NATURAL PLANNING METHODS  Natural family planning. This is not having sexual intercourse or using a barrier method (condom, diaphragm, cervical cap) on days the woman could become pregnant.  Calendar method. This is keeping track of the length of each menstrual cycle and identifying when you are fertile.  Ovulation method. This is avoiding sexual intercourse during ovulation.  Symptothermal method. This is avoiding sexual intercourse during ovulation, using a thermometer and ovulation symptoms.  Post-ovulation method. This is timing sexual intercourse after you have ovulated. Regardless of which type or method of contraception you choose, it is important that you use condoms to protect against the transmission of sexually transmitted infections  (STIs). Talk with your health care provider about which form of contraception is most appropriate for you. Document Released: 03/23/2005 Document Revised: 03/28/2013 Document Reviewed: 09/15/2012 [facility] Patient Information 2015 [place], [place]. This information is not intended to replace advice given to you by your health care provider. Make sure you discuss any questions you have with your health care provider.  Fetal Movement Counts Patient Name: __________________________________________________ Patient Due Date: ____________________ Performing a fetal movement count is highly recommended in high-risk pregnancies, but it is good for every pregnant woman to do. Your health care provider may ask you to start counting fetal movements at 28 weeks of the pregnancy. Fetal movements often increase:  After eating a full meal.  After physical activity.  After eating or drinking something sweet or cold.  At rest. Pay attention to when you feel the baby is most active. This will help you notice a pattern of your baby's sleep and wake cycles and what factors  contribute to an increase in fetal movement. It is important to perform a fetal movement count at the same time each day when your baby is normally most active.  HOW TO COUNT FETAL MOVEMENTS 1. Find a quiet and comfortable area to sit or lie down on your left side. Lying on your left side provides the best blood and oxygen circulation to your baby. 2. Write down the day and time on a sheet of paper or in a journal. 3. Start counting kicks, flutters, swishes, rolls, or jabs in a 2-hour period. You should feel at least 10 movements within 2 hours. 4. If you do not feel 10 movements in 2 hours, wait 2-3 hours and count again. Look for a change in the pattern or not enough counts in 2 hours. SEEK MEDICAL CARE IF:  You feel less than 10 counts in 2 hours, tried twice.  There is no movement in over an hour.  The pattern is changing or taking longer  each day to reach 10 counts in 2 hours.  You feel the baby is not moving as he or she usually does. Date: ____________ Movements: ____________ Start time: ____________ Doreatha Martin time: ____________  Date: ____________ Movements: ____________ Start time: ____________ Doreatha Martin time: ____________ Date: ____________ Movements: ____________ Start time: ____________ Doreatha Martin time: ____________ Date: ____________ Movements: ____________ Start time: ____________ Doreatha Martin time: ____________ Date: ____________ Movements: ____________ Start time: ____________ Doreatha Martin time: ____________ Date: ____________ Movements: ____________ Start time: ____________ Doreatha Martin time: ____________ Date: ____________ Movements: ____________ Start time: ____________ Doreatha Martin time: ____________ Date: ____________ Movements: ____________ Start time: ____________ Doreatha Martin time: ____________  Date: ____________ Movements: ____________ Start time: ____________ Doreatha Martin time: ____________ Date: ____________ Movements: ____________ Start time: ____________ Doreatha Martin time: ____________ Date: ____________ Movements: ____________ Start time: ____________ Doreatha Martin time: ____________ Date: ____________ Movements: ____________ Start time: ____________ Doreatha Martin time: ____________ Date: ____________ Movements: ____________ Start time: ____________ Doreatha Martin time: ____________ Date: ____________ Movements: ____________ Start time: ____________ Doreatha Martin time: ____________ Date: ____________ Movements: ____________ Start time: ____________ Doreatha Martin time: ____________  Date: ____________ Movements: ____________ Start time: ____________ Doreatha Martin time: ____________ Date: ____________ Movements: ____________ Start time: ____________ Doreatha Martin time: ____________ Date: ____________ Movements: ____________ Start time: ____________ Doreatha Martin time: ____________ Date: ____________ Movements: ____________ Start time: ____________ Doreatha Martin time: ____________ Date: ____________ Movements:  ____________ Start time: ____________ Doreatha Martin time: ____________ Date: ____________ Movements: ____________ Start time: ____________ Doreatha Martin time: ____________ Date: ____________ Movements: ____________ Start time: ____________ Doreatha Martin time: ____________  Date: ____________ Movements: ____________ Start time: ____________ Doreatha Martin time: ____________ Date: ____________ Movements: ____________ Start time: ____________ Doreatha Martin time: ____________ Date: ____________ Movements: ____________ Start time: ____________ Doreatha Martin time: ____________ Date: ____________ Movements: ____________ Start time: ____________ Doreatha Martin time: ____________ Date: ____________ Movements: ____________ Start time: ____________ Doreatha Martin time: ____________ Date: ____________ Movements: ____________ Start time: ____________ Doreatha Martin time: ____________ Date: ____________ Movements: ____________ Start time: ____________ Doreatha Martin time: ____________  Date: ____________ Movements: ____________ Start time: ____________ Doreatha Martin time: ____________ Date: ____________ Movements: ____________ Start time: ____________ Doreatha Martin time: ____________ Date: ____________ Movements: ____________ Start time: ____________ Doreatha Martin time: ____________ Date: ____________ Movements: ____________ Start time: ____________ Doreatha Martin time: ____________ Date: ____________ Movements: ____________ Start time: ____________ Doreatha Martin time: ____________ Date: ____________ Movements: ____________ Start time: ____________ Doreatha Martin time: ____________ Date: ____________ Movements: ____________ Start time: ____________ Doreatha Martin time: ____________  Date: ____________ Movements: ____________ Start time: ____________ Doreatha Martin time: ____________ Date: ____________ Movements: ____________ Start time: ____________ Doreatha Martin time: ____________ Date: ____________ Movements: ____________ Start time: ____________ Doreatha Martin time: ____________ Date: ____________ Movements: ____________ Start time: ____________ Doreatha Martin  time:  ____________ Date: ____________ Movements: ____________ Start time: ____________ Doreatha Martin time: ____________ Date: ____________ Movements: ____________ Start time: ____________ Doreatha Martin time: ____________ Date: ____________ Movements: ____________ Start time: ____________ Doreatha Martin time: ____________  Date: ____________ Movements: ____________ Start time: ____________ Doreatha Martin time: ____________ Date: ____________ Movements: ____________ Start time: ____________ Doreatha Martin time: ____________ Date: ____________ Movements: ____________ Start time: ____________ Doreatha Martin time: ____________ Date: ____________ Movements: ____________ Start time: ____________ Doreatha Martin time: ____________ Date: ____________ Movements: ____________ Start time: ____________ Doreatha Martin time: ____________ Date: ____________ Movements: ____________ Start time: ____________ Doreatha Martin time: ____________ Date: ____________ Movements: ____________ Start time: ____________ Doreatha Martin time: ____________  Date: ____________ Movements: ____________ Start time: ____________ Doreatha Martin time: ____________ Date: ____________ Movements: ____________ Start time: ____________ Doreatha Martin time: ____________ Date: ____________ Movements: ____________ Start time: ____________ Doreatha Martin time: ____________ Date: ____________ Movements: ____________ Start time: ____________ Doreatha Martin time: ____________ Date: ____________ Movements: ____________ Start time: ____________ Doreatha Martin time: ____________ Date: ____________ Movements: ____________ Start time: ____________ Doreatha Martin time: ____________ Document Released: 04/22/2006 Document Revised: 08/07/2013 Document Reviewed: 01/18/2012 ExitCare Patient Information 2015 [place]. This information is not intended to replace advice given to you by your health care provider. Make sure you discuss any questions you have with your health care provider.

## 2013-12-01 ENCOUNTER — Ambulatory Visit (INDEPENDENT_AMBULATORY_CARE_PROVIDER_SITE_OTHER): Payer: Medicaid Other | Admitting: Obstetrics & Gynecology

## 2013-12-01 VITALS — BP 135/88 | HR 101 | Temp 98.6°F | Wt 274.0 lb

## 2013-12-01 DIAGNOSIS — O0930 Supervision of pregnancy with insufficient antenatal care, unspecified trimester: Secondary | ICD-10-CM

## 2013-12-01 DIAGNOSIS — O0933 Supervision of pregnancy with insufficient antenatal care, third trimester: Secondary | ICD-10-CM

## 2013-12-01 LAB — POCT URINALYSIS DIP (DEVICE)
Glucose, UA: NEGATIVE mg/dL
Hgb urine dipstick: NEGATIVE
Ketones, ur: 15 mg/dL — AB
LEUKOCYTES UA: NEGATIVE
NITRITE: NEGATIVE
PH: 5.5 (ref 5.0–8.0)
Protein, ur: NEGATIVE mg/dL
Specific Gravity, Urine: 1.025 (ref 1.005–1.030)
Urobilinogen, UA: 1 mg/dL (ref 0.0–1.0)

## 2013-12-01 NOTE — Patient Instructions (Signed)

## 2013-12-01 NOTE — Progress Notes (Signed)
Pressure, takes protonix and tums for reflux. Labor precautions given. [place] result 8/12 reviewed, nl dopplers and AFI and growth

## 2013-12-01 NOTE — Progress Notes (Signed)
Pain-pelvic   Pressure-pelvic/vaginal

## 2013-12-06 ENCOUNTER — Encounter (HOSPITAL_COMMUNITY): Payer: Self-pay

## 2013-12-06 ENCOUNTER — Inpatient Hospital Stay (HOSPITAL_COMMUNITY)
Admission: RE | Admit: 2013-12-06 | Discharge: 2013-12-11 | DRG: 765 | Disposition: A | Discharge status: Home / Self Care | Payer: Medicaid Other | Source: Ambulatory Visit | Attending: Obstetrics & Gynecology | Admitting: Obstetrics & Gynecology

## 2013-12-06 ENCOUNTER — Ambulatory Visit (INDEPENDENT_AMBULATORY_CARE_PROVIDER_SITE_OTHER): Payer: Medicaid Other | Admitting: Advanced Practice Midwife

## 2013-12-06 ENCOUNTER — Telehealth (HOSPITAL_COMMUNITY): Payer: Self-pay | Admitting: *Deleted

## 2013-12-06 ENCOUNTER — Encounter (HOSPITAL_COMMUNITY): Payer: Self-pay | Admitting: *Deleted

## 2013-12-06 VITALS — BP 120/90 | HR 102 | Temp 98.4°F | Wt 273.0 lb

## 2013-12-06 DIAGNOSIS — O99210 Obesity complicating pregnancy, unspecified trimester: Secondary | ICD-10-CM

## 2013-12-06 DIAGNOSIS — O0930 Supervision of pregnancy with insufficient antenatal care, unspecified trimester: Secondary | ICD-10-CM

## 2013-12-06 DIAGNOSIS — E669 Obesity, unspecified: Secondary | ICD-10-CM | POA: Diagnosis present

## 2013-12-06 DIAGNOSIS — Z6841 Body Mass Index (BMI) 40.0 and over, adult: Secondary | ICD-10-CM | POA: Diagnosis not present

## 2013-12-06 DIAGNOSIS — Z87891 Personal history of nicotine dependence: Secondary | ICD-10-CM

## 2013-12-06 DIAGNOSIS — O480 Post-term pregnancy: Secondary | ICD-10-CM | POA: Diagnosis present

## 2013-12-06 DIAGNOSIS — O99214 Obesity complicating childbirth: Secondary | ICD-10-CM

## 2013-12-06 DIAGNOSIS — O365990 Maternal care for other known or suspected poor fetal growth, unspecified trimester, not applicable or unspecified: Secondary | ICD-10-CM | POA: Diagnosis present

## 2013-12-06 DIAGNOSIS — O0933 Supervision of pregnancy with insufficient antenatal care, third trimester: Secondary | ICD-10-CM

## 2013-12-06 LAB — TYPE AND SCREEN
ABO/RH(D): O POS
Antibody Screen: NEGATIVE

## 2013-12-06 LAB — POCT URINALYSIS DIP (DEVICE)
Bilirubin Urine: NEGATIVE
GLUCOSE, UA: NEGATIVE mg/dL
Hgb urine dipstick: NEGATIVE
KETONES UR: NEGATIVE mg/dL
Leukocytes, UA: NEGATIVE
Nitrite: NEGATIVE
PROTEIN: NEGATIVE mg/dL
Specific Gravity, Urine: 1.015 (ref 1.005–1.030)
UROBILINOGEN UA: 0.2 mg/dL (ref 0.0–1.0)
pH: 7 (ref 5.0–8.0)

## 2013-12-06 LAB — CBC
HCT: 39.1 % (ref 36.0–46.0)
Hemoglobin: 12.9 g/dL (ref 12.0–15.0)
MCH: 30.1 pg (ref 26.0–34.0)
MCHC: 33 g/dL (ref 30.0–36.0)
MCV: 91.4 fL (ref 78.0–100.0)
Platelets: 136 10*3/uL — ABNORMAL LOW (ref 150–400)
RBC: 4.28 MIL/uL (ref 3.87–5.11)
RDW: 14.5 % (ref 11.5–15.5)
WBC: 6.5 10*3/uL (ref 4.0–10.5)

## 2013-12-06 MED ORDER — FENTANYL CITRATE 0.05 MG/ML IJ SOLN
100.0000 ug | INTRAMUSCULAR | Status: DC | PRN
  Administered 2013-12-07 – 2013-12-08 (×6): 100 ug via INTRAVENOUS
  Filled 2013-12-06 (×6): qty 2

## 2013-12-06 MED ORDER — LIDOCAINE HCL (PF) 1 % IJ SOLN: 30.0000 mL | INTRAMUSCULAR | Status: DC | PRN

## 2013-12-06 MED ORDER — LACTATED RINGERS IV SOLN: 500.0000 mL | INTRAVENOUS | Status: DC | PRN

## 2013-12-06 MED ORDER — FLEET ENEMA 7-19 GM/118ML RE ENEM: 1.0000 | ENEMA | RECTAL | Status: DC | PRN

## 2013-12-06 MED ORDER — LACTATED RINGERS IV SOLN
INTRAVENOUS | Status: DC
  Administered 2013-12-06 – 2013-12-09 (×10): via INTRAVENOUS

## 2013-12-06 MED ORDER — OXYTOCIN BOLUS FROM INFUSION: 500.0000 mL | INTRAVENOUS | Status: DC

## 2013-12-06 MED ORDER — ACETAMINOPHEN 325 MG PO TABS: 650.0000 mg | ORAL_TABLET | ORAL | Status: DC | PRN

## 2013-12-06 MED ORDER — OXYCODONE-ACETAMINOPHEN 5-325 MG PO TABS: 2.0000 | ORAL_TABLET | ORAL | Status: DC | PRN

## 2013-12-06 MED ORDER — ZOLPIDEM TARTRATE 5 MG PO TABS
5.0000 mg | ORAL_TABLET | Freq: Every evening | ORAL | Status: DC | PRN
  Administered 2013-12-07: 5 mg via ORAL
  Filled 2013-12-06: qty 1

## 2013-12-06 MED ORDER — ONDANSETRON HCL 4 MG/2ML IJ SOLN: 4.0000 mg | Freq: Four times a day (QID) | INTRAMUSCULAR | Status: DC | PRN

## 2013-12-06 MED ORDER — OXYCODONE-ACETAMINOPHEN 5-325 MG PO TABS: 1.0000 | ORAL_TABLET | ORAL | Status: DC | PRN

## 2013-12-06 MED ORDER — OXYTOCIN 40 UNITS IN LACTATED RINGERS INFUSION - SIMPLE MED: 62.5000 mL/h | INTRAVENOUS | Status: DC

## 2013-12-06 MED ORDER — PHENYLEPHRINE 40 MCG/ML (10ML) SYRINGE FOR IV PUSH (FOR BLOOD PRESSURE SUPPORT)
PREFILLED_SYRINGE | INTRAVENOUS | Status: DC
Start: 2013-12-06 — End: 2013-12-06
  Filled 2013-12-06: qty 10

## 2013-12-06 MED ORDER — CITRIC ACID-SODIUM CITRATE 334-500 MG/5ML PO SOLN
30.0000 mL | ORAL | Status: DC | PRN
  Administered 2013-12-09: 30 mL via ORAL
  Filled 2013-12-06: qty 15

## 2013-12-06 MED ORDER — TERBUTALINE SULFATE 1 MG/ML IJ SOLN: 0.2500 mg | Freq: Once | INTRAMUSCULAR | Status: AC | PRN

## 2013-12-06 MED ORDER — MISOPROSTOL 25 MCG QUARTER TABLET
25.0000 ug | ORAL_TABLET | ORAL | Status: DC
  Administered 2013-12-06 – 2013-12-07 (×3): 25 ug via VAGINAL
  Filled 2013-12-06 (×3): qty 0.25

## 2013-12-06 NOTE — H&P (Signed)
Monica Harding is a 23 y.o. female G1 @ 40.4wks by 25wk U/S presenting for IOL due to fetal AC lag noted on U/S @ 34wks & 37wks. She had a scan with nl AFI/dopplers @ 37wks showing 34%th EFW but persistent lagging AC, and it was recommended by MFM that she be induced tonight. Denies N/V/D; no leaking or bldg. Her preg has been followed by [facility] and has been remarkable for 1) late prenatal care starting at 28wks 2) obesity 3) AC lag in fetal growth History OB History   Grav Para Term Preterm Abortions TAB SAB Ect Mult Living   1              History reviewed. No pertinent past medical history. History reviewed. No pertinent past surgical history. Family History: family history is not on file. Social History:  reports that she has quit smoking. She has never used smokeless tobacco. She reports that she drinks alcohol. She reports that she does not use illicit drugs.   Prenatal Transfer Tool  Maternal Diabetes: No Genetic Screening: Declined- too late Maternal Ultrasounds/Referrals: Normal Fetal Ultrasounds or other Referrals:  Referred to Materal Fetal Medicine for [facility] growth lag noted Maternal Substance Abuse:  No Significant Maternal Medications:  None Significant Maternal Lab Results:  Lab values include: Group B Strep negative Other Comments:  None  ROS  Dilation: Closed Effacement (%): Thick Station: -3 Exam by:: [employer] Blood pressure 126/80, pulse 112, temperature 98.7 F (37.1 C), temperature source Oral, resp. rate 18, height  (1.727 m), weight 123.832 kg (273 lb), last menstrual period 12/27/2012. Exam Physical Exam  Constitutional: She is oriented to person, place, and time. She appears well-developed.  HENT:  Head: Normocephalic.  Neck: Normal range of motion.  GI:  EFM 140s, +accels, no decels, occ mi variables Rare ctx  Musculoskeletal: Normal range of motion.  Neurological: She is alert and oriented to person, place, and time.  Skin: Skin is warm and  dry.  Psychiatric: She has a normal mood and affect. Her behavior is normal. Thought content normal.    Prenatal labs: ABO, Rh: --/--/O POS (09/02 2105) Antibody: PENDING (09/02 2105) Rubella: 3.02 (05/19 1600) RPR: NON REAC (05/19 1600)  HBsAg: NEGATIVE (05/19 1600)  HIV:    GBS: Negative (07/30 0000)   Assessment/Plan: IUP @ 40.4wks AC lag on fetal growth Unfavorable cx  Admit to [facility] for IOL per MFM Will use cytotec for cx ripening and then plan foley bulb prn  Process rev'd with pt; questions answered  Cam Hai CNM 12/06/2013, 10:04 PM

## 2013-12-06 NOTE — Patient Instructions (Signed)

## 2013-12-06 NOTE — Progress Notes (Signed)
Doing well. Good FM. Discussed IOL.  (recommended by MFM).  Will schedule for tonight at 1930 hrs.  Will need Cytotec then Foley.

## 2013-12-06 NOTE — Progress Notes (Signed)
Reports feeling baby move less throughout the day but often at night.  C/o of pelvic pressure.  Reports edema in feet.

## 2013-12-06 NOTE — Telephone Encounter (Signed)
Preadmission screen  

## 2013-12-07 LAB — RPR

## 2013-12-07 MED ORDER — OXYTOCIN 40 UNITS IN LACTATED RINGERS INFUSION - SIMPLE MED
1.0000 m[IU]/min | INTRAVENOUS | Status: DC
  Filled 2013-12-07: qty 1000

## 2013-12-07 NOTE — Progress Notes (Addendum)
DARLINE FAITH is a 23 y.o. G1P0 at [redacted]w[redacted]d.  Subjective: Cramping w/ UC's  Objective: BP 134/90  Pulse 72  Temp(Src) 98.8 F (37.1 C) (Oral)  Resp 18  Ht  (1.727 m)  Wt 123.832 kg (273 lb)  BMI 41.52 kg/m2  LMP 12/27/2012      FHT:  FHR: 145 bpm, variability: moderate,  accelerations:  Present,  decelerations:  Absent UC:   regular, every 1-2 minutes, mild SVE:   Dilation: 1 Effacement (%): 50 Station: -3 Exam by:: Nekita Pita, cnm Foley bulb placed w/out difficulty.  Labs: Lab Results  Component Value Date   WBC 6.5 12/06/2013   HGB 12.9 12/06/2013   HCT 39.1 12/06/2013   MCV 91.4 12/06/2013   PLT 136* 12/06/2013    Assessment / Plan: Induction of labor due to IUGR  Labor: Progressing normally Preeclampsia:  NA Fetal Wellbeing:  Category I Pain Control:  Fentanyl I/D:  n/a Anticipated MOD:  NSVD  Kayzlee Wirtanen 12/07/2013, 11:21 AM

## 2013-12-07 NOTE — Progress Notes (Signed)
   Monica Harding is a 23 y.o. G1P0 at [redacted]w[redacted]d  admitted for IOL for SGA  Subjective: A little crampy. Using fentanyl prn  Objective: Filed Vitals:   12/07/13 1405 12/07/13 1809 12/07/13 2021 12/07/13 2211  BP: 132/69 121/80 128/85 128/76  Pulse: 74 86 82 96  Temp:  98.2 F (36.8 C)  97.5 F (36.4 C)  TempSrc:    Oral  Resp:  Height:      Weight:          FHT:  FHR: 150 bpm, variability: moderate,  accelerations:  Present,  decelerations:  Absent UC:   Rare, mild SVE:   Dilation: 1 Effacement (%): 50 Station: -3 Exam by:: smith, cnm Foley still in  Labs: Lab Results  Component Value Date   WBC 6.5 12/06/2013   HGB 12.9 12/06/2013   HCT 39.1 12/06/2013   MCV 91.4 12/06/2013   PLT 136* 12/06/2013    Assessment / Plan: IOL, ripening phase Will start pitocin when foley falls out Labor: no Fetal Wellbeing:  Category I Pain Control:  Fentanyl Anticipated MOD:  NSVD  CRESENZO-DISHMAN,Laloni Rowton 12/07/2013, 11:35 PM

## 2013-12-08 ENCOUNTER — Encounter (HOSPITAL_COMMUNITY): Payer: Medicaid Other | Admitting: Anesthesiology

## 2013-12-08 ENCOUNTER — Inpatient Hospital Stay (HOSPITAL_COMMUNITY): Payer: Medicaid Other | Admitting: Anesthesiology

## 2013-12-08 LAB — CBC
HCT: 35.8 % — ABNORMAL LOW (ref 36.0–46.0)
HEMOGLOBIN: 12.1 g/dL (ref 12.0–15.0)
MCH: 30.7 pg (ref 26.0–34.0)
MCHC: 33.8 g/dL (ref 30.0–36.0)
MCV: 90.9 fL (ref 78.0–100.0)
Platelets: 134 10*3/uL — ABNORMAL LOW (ref 150–400)
RBC: 3.94 MIL/uL (ref 3.87–5.11)
RDW: 14.1 % (ref 11.5–15.5)
WBC: 10 10*3/uL (ref 4.0–10.5)

## 2013-12-08 MED ORDER — LIDOCAINE HCL (PF) 1 % IJ SOLN
INTRAMUSCULAR | Status: DC | PRN
  Administered 2013-12-08: 5 mL
  Administered 2013-12-08: 3 mL
  Administered 2013-12-08: 5 mL

## 2013-12-08 MED ORDER — LACTATED RINGERS IV SOLN
INTRAVENOUS | Status: DC
  Administered 2013-12-08: via INTRAUTERINE

## 2013-12-08 MED ORDER — DIPHENHYDRAMINE HCL 50 MG/ML IJ SOLN: 12.5000 mg | INTRAMUSCULAR | Status: DC | PRN

## 2013-12-08 MED ORDER — FENTANYL 2.5 MCG/ML BUPIVACAINE 1/10 % EPIDURAL INFUSION (WH - ANES)
14.0000 mL/h | INTRAMUSCULAR | Status: DC | PRN
  Administered 2013-12-08: 14 mL/h via EPIDURAL
  Filled 2013-12-08: qty 125

## 2013-12-08 MED ORDER — LACTATED RINGERS IV SOLN: 500.0000 mL | Freq: Once | INTRAVENOUS | Status: DC

## 2013-12-08 MED ORDER — EPHEDRINE 5 MG/ML INJ: 10.0000 mg | INTRAVENOUS | Status: DC | PRN

## 2013-12-08 MED ORDER — MISOPROSTOL 25 MCG QUARTER TABLET
50.0000 ug | ORAL_TABLET | ORAL | Status: DC
  Filled 2013-12-08 (×2): qty 1

## 2013-12-08 MED ORDER — PHENYLEPHRINE 40 MCG/ML (10ML) SYRINGE FOR IV PUSH (FOR BLOOD PRESSURE SUPPORT): 80.0000 ug | PREFILLED_SYRINGE | INTRAVENOUS | Status: DC | PRN

## 2013-12-08 MED ORDER — PHENYLEPHRINE 40 MCG/ML (10ML) SYRINGE FOR IV PUSH (FOR BLOOD PRESSURE SUPPORT)
80.0000 ug | PREFILLED_SYRINGE | INTRAVENOUS | Status: DC | PRN
  Filled 2013-12-08: qty 10

## 2013-12-08 MED ORDER — OXYTOCIN 40 UNITS IN LACTATED RINGERS INFUSION - SIMPLE MED
1.0000 m[IU]/min | INTRAVENOUS | Status: DC
  Administered 2013-12-08: 2 m[IU]/min via INTRAVENOUS

## 2013-12-08 MED ORDER — FENTANYL 2.5 MCG/ML BUPIVACAINE 1/10 % EPIDURAL INFUSION (WH - ANES)
INTRAMUSCULAR | Status: DC | PRN
  Administered 2013-12-08: 14 mL/h via EPIDURAL

## 2013-12-08 NOTE — Progress Notes (Signed)
Monica Harding is a 23 y.o. G1P0 at [redacted]w[redacted]d admitted for induction of labor due to [facility] lag.  Subjective: Sleeping, comfortable  Objective: BP 113/55  Pulse 146  Temp(Src) 98.5 F (36.9 C) (Oral)  Resp 18  Ht  (1.727 m)  Wt 273 lb (123.832 kg)  BMI 41.52 kg/m2  SpO2 98%  LMP 12/27/2012      FHT:  FHR: 150s bpm, variability: minimal ,  accelerations:  Absent,  decelerations:  Recurrent late decels UC:   q2-16min SVE:   Dilation: 3.5 Effacement (%): 100 Station: -1 Exam by:: [employer]: Lab Results  Component Value Date   WBC 10.0 12/08/2013   HGB 12.1 12/08/2013   HCT 35.8* 12/08/2013   MCV 90.9 12/08/2013   PLT 134* 12/08/2013    Assessment / Plan: IOL for AC lag  Labor: IOL without much progress, will give pit break Preeclampsia:  no signs or symptoms of toxicity Fetal Wellbeing:  Category II, pit break, trial of amnioinfusion Pain Control:  Fentanyl I/D:  n/a Anticipated MOD:  guarded, Dr. Penne Lash notified of recurrent lates x 1 hour, discussed lates with patient and possible need for cesarean section  Monica Harding Monica Harding 12/08/2013, 11:33 PM

## 2013-12-08 NOTE — Anesthesia Preprocedure Evaluation (Signed)
Anesthesia Evaluation  Patient identified by MRN, date of birth, ID band Patient awake    Reviewed: Allergy & Precautions, H&P , NPO status , Patient's Chart, lab work & pertinent test results  History of Anesthesia Complications Negative for: history of anesthetic complications  Airway Mallampati: II TM Distance: >3 FB Neck ROM: full    Dental no notable dental hx. (+) Teeth Intact   Pulmonary neg pulmonary ROS, former smoker,  breath sounds clear to auscultation  Pulmonary exam normal       Cardiovascular negative cardio ROS  Rhythm:regular Rate:Normal     Neuro/Psych negative neurological ROS  negative psych ROS   GI/Hepatic negative GI ROS, Neg liver ROS,   Endo/Other  negative endocrine ROSMorbid obesity  Renal/GU negative Renal ROS  negative genitourinary   Musculoskeletal   Abdominal Normal abdominal exam  (+)   Peds  Hematology negative hematology ROS (+)   Anesthesia Other Findings   Reproductive/Obstetrics (+) Pregnancy                           Anesthesia Physical Anesthesia Plan  ASA: II  Anesthesia Plan: Epidural   Post-op Pain Management:    Induction:   Airway Management Planned:   Additional Equipment:   Intra-op Plan:   Post-operative Plan:   Informed Consent: I have reviewed the patients History and Physical, chart, labs and discussed the procedure including the risks, benefits and alternatives for the proposed anesthesia with the patient or authorized representative who has indicated his/her understanding and acceptance.     Plan Discussed with:   Anesthesia Plan Comments:         Anesthesia Quick Evaluation

## 2013-12-08 NOTE — Progress Notes (Signed)
Monica Harding is a 23 y.o. G1P0 at [redacted]w[redacted]d admitted for induction of labor due to [facility] lag.  Subjective: Uncomfortable with foley balloon, which has been in for almost 24 hours.  Objective: BP 131/83  Pulse 106  Temp(Src) 97.8 F (36.6 C) (Oral)  Resp 20  Ht  (1.727 m)  Wt 123.832 kg (273 lb)  BMI 41.52 kg/m2  LMP 12/27/2012      FHT:  FHR: 150s bpm, variability: minimal ,  accelerations:  Abscent,  decelerations:  Absent UC:   irregular, every 15 minutes SVE:   Dilation: 1 Effacement (%): 50 Station: -3 Exam by:: Monica Karnes, md  Labs: Lab Results  Component Value Date   WBC 6.5 12/06/2013   HGB 12.9 12/06/2013   HCT 39.1 12/06/2013   MCV 91.4 12/06/2013   PLT 136* 12/06/2013    Assessment / Plan: IOL for AC lag.  Foley balloon almost out.  Start pitocin at low dose.  D/c foley at noon.   Labor: IOL without much progress Preeclampsia:  no signs or symptoms of toxicity Fetal Wellbeing:  Category II Pain Control:  Fentanyl I/D:  n/a Anticipated MOD:  guarded  Monica Harding 12/08/2013, 9:35 AM

## 2013-12-08 NOTE — Anesthesia Procedure Notes (Signed)
Epidural Patient location during procedure: OB  Staffing Anesthesiologist: Kaylah Chiasson Performed by: anesthesiologist   Preanesthetic Checklist Completed: patient identified, site marked, surgical consent, pre-op evaluation, timeout performed, IV checked, risks and benefits discussed and monitors and equipment checked  Epidural Patient position: sitting Prep: ChloraPrep Patient monitoring: heart rate, continuous pulse ox and blood pressure Approach: right paramedian Location: L3-L4 Injection technique: LOR saline  Needle:  Needle type: Tuohy  Needle gauge: 17 G Needle length: 9 cm and 9 Needle insertion depth: 7 cm Catheter type: closed end flexible Catheter size: 20 Guage Catheter at skin depth: 12 cm Test dose: negative  Assessment Events: blood not aspirated, injection not painful, no injection resistance, negative IV test and no paresthesia  Additional Notes   Patient tolerated the insertion well without complications.   

## 2013-12-08 NOTE — Progress Notes (Signed)
Monica Harding is a 23 y.o. G1P0 at [redacted]w[redacted]d admitted for induction of labor due to [facility] lag.  Subjective: Feeling pressure  Objective: BP 133/79  Pulse 87  Temp(Src) 97.8 F (36.6 C) (Oral)  Resp 18  Ht  (1.727 m)  Wt 273 lb (123.832 kg)  BMI 41.52 kg/m2  LMP 12/27/2012      FHT:  FHR: 150s bpm, variability: minimal ,  accelerations:  Absent,  decelerations:  Absent UC:   Irregular q4-40min, poor tracing with toco SVE:   Dilation: 3 Effacement (%): 60 Station: -3 Exam by:: Manali Mcelmurry. md Foley balloon removed, cervix posterior, very soft  Labs: Lab Results  Component Value Date   WBC 6.5 12/06/2013   HGB 12.9 12/06/2013   HCT 39.1 12/06/2013   MCV 91.4 12/06/2013   PLT 136* 12/06/2013    Assessment / Plan: IOL for AC lag  Labor: IOL without much progress, continue pitocin Preeclampsia:  no signs or symptoms of toxicity Fetal Wellbeing:  Category II Pain Control:  Fentanyl I/D:  n/a Anticipated MOD:  guarded   Monica Harding ROCIO 12/08/2013, 12:20 PM

## 2013-12-08 NOTE — Progress Notes (Signed)
   Monica Harding is a 23 y.o. G1P0 at [redacted]w[redacted]d  admitted for induction of labor due to Poor fetal growth.  Subjective:  sleeping Objective: Filed Vitals:   12/08/13 0130 12/08/13 0330 12/08/13 0431 12/08/13 0635  BP: 133/83  125/67 131/83  Pulse: 86  77 106  Temp: 97.9 F (36.6 C)   99.1 F (37.3 C)  TempSrc: Oral   Oral  Resp: Height:      Weight:          FHT:  FHR: 150 bpm, variability: moderate,  accelerations:  Present,  decelerations:  Absent UC:   q6 SVE:  Deferred. Foley still in  Labs: Lab Results  Component Value Date   WBC 6.5 12/06/2013   HGB 12.9 12/06/2013   HCT 39.1 12/06/2013   MCV 91.4 12/06/2013   PLT 136* 12/06/2013    Assessment / Plan: IOL, ripening phase, slow.  Will restart cytotoec (PO)  Labor: no Fetal Wellbeing:  Category I Pain Control:  Fentanyl Anticipated MOD:  NSVD  CRESENZO-DISHMAN,Rehanna Oloughlin 12/08/2013, 7:05 AM

## 2013-12-09 ENCOUNTER — Encounter (HOSPITAL_COMMUNITY)
Admission: RE | Disposition: A | Discharge status: Home / Self Care | Payer: Self-pay | Source: Ambulatory Visit | Attending: Obstetrics & Gynecology

## 2013-12-09 ENCOUNTER — Encounter (HOSPITAL_COMMUNITY): Payer: Self-pay

## 2013-12-09 DIAGNOSIS — E669 Obesity, unspecified: Secondary | ICD-10-CM

## 2013-12-09 DIAGNOSIS — O365990 Maternal care for other known or suspected poor fetal growth, unspecified trimester, not applicable or unspecified: Secondary | ICD-10-CM

## 2013-12-09 DIAGNOSIS — Z6841 Body Mass Index (BMI) 40.0 and over, adult: Secondary | ICD-10-CM

## 2013-12-09 DIAGNOSIS — O99214 Obesity complicating childbirth: Secondary | ICD-10-CM

## 2013-12-09 DIAGNOSIS — O480 Post-term pregnancy: Secondary | ICD-10-CM

## 2013-12-09 LAB — CBC
HCT: 33.5 % — ABNORMAL LOW (ref 36.0–46.0)
Hemoglobin: 11.1 g/dL — ABNORMAL LOW (ref 12.0–15.0)
MCH: 30.2 pg (ref 26.0–34.0)
MCHC: 33.1 g/dL (ref 30.0–36.0)
MCV: 91 fL (ref 78.0–100.0)
PLATELETS: 127 10*3/uL — AB (ref 150–400)
RBC: 3.68 MIL/uL — ABNORMAL LOW (ref 3.87–5.11)
RDW: 14 % (ref 11.5–15.5)
WBC: 11 10*3/uL — AB (ref 4.0–10.5)

## 2013-12-09 SURGERY — Surgical Case
Anesthesia: Epidural

## 2013-12-09 MED ORDER — FENTANYL CITRATE 0.05 MG/ML IJ SOLN
INTRAMUSCULAR | Status: AC
  Filled 2013-12-09: qty 2

## 2013-12-09 MED ORDER — ONDANSETRON HCL 4 MG/2ML IJ SOLN: 4.0000 mg | Freq: Three times a day (TID) | INTRAMUSCULAR | Status: DC | PRN

## 2013-12-09 MED ORDER — SODIUM BICARBONATE 8.4 % IV SOLN
INTRAVENOUS | Status: AC
  Filled 2013-12-09: qty 50

## 2013-12-09 MED ORDER — KETOROLAC TROMETHAMINE 30 MG/ML IJ SOLN
30.0000 mg | Freq: Four times a day (QID) | INTRAMUSCULAR | Status: AC | PRN
Start: 2013-12-09 — End: 2013-12-10

## 2013-12-09 MED ORDER — MORPHINE SULFATE (PF) 0.5 MG/ML IJ SOLN
INTRAMUSCULAR | Status: DC | PRN
  Administered 2013-12-09: 4 mg via EPIDURAL

## 2013-12-09 MED ORDER — LACTATED RINGERS IV SOLN
INTRAVENOUS | Status: DC
  Administered 2013-12-09 (×2): via INTRAVENOUS

## 2013-12-09 MED ORDER — NALBUPHINE HCL 10 MG/ML IJ SOLN: 5.0000 mg | INTRAMUSCULAR | Status: DC | PRN

## 2013-12-09 MED ORDER — OXYCODONE-ACETAMINOPHEN 5-325 MG PO TABS
1.0000 | ORAL_TABLET | ORAL | Status: DC | PRN
  Administered 2013-12-10 – 2013-12-11 (×2): 1 via ORAL
  Filled 2013-12-09 (×2): qty 1

## 2013-12-09 MED ORDER — OXYTOCIN 10 UNIT/ML IJ SOLN
INTRAMUSCULAR | Status: AC
  Filled 2013-12-09: qty 4

## 2013-12-09 MED ORDER — CEFAZOLIN SODIUM-DEXTROSE 2-3 GM-% IV SOLR
INTRAVENOUS | Status: AC
  Filled 2013-12-09: qty 50

## 2013-12-09 MED ORDER — SCOPOLAMINE 1 MG/3DAYS TD PT72
1.0000 | MEDICATED_PATCH | Freq: Once | TRANSDERMAL | Status: DC
  Administered 2013-12-09: 1.5 mg via TRANSDERMAL

## 2013-12-09 MED ORDER — ONDANSETRON HCL 4 MG/2ML IJ SOLN
INTRAMUSCULAR | Status: DC | PRN
  Administered 2013-12-09: 4 mg via INTRAVENOUS

## 2013-12-09 MED ORDER — WITCH HAZEL-GLYCERIN EX PADS
1.0000 "application " | MEDICATED_PAD | CUTANEOUS | Status: DC | PRN
  Administered 2013-12-10: 1 via TOPICAL

## 2013-12-09 MED ORDER — DIPHENHYDRAMINE HCL 50 MG/ML IJ SOLN: 25.0000 mg | INTRAMUSCULAR | Status: DC | PRN

## 2013-12-09 MED ORDER — SODIUM BICARBONATE 8.4 % IV SOLN
INTRAVENOUS | Status: DC | PRN
  Administered 2013-12-09: 5 mL via EPIDURAL
  Administered 2013-12-09: 2 mL via EPIDURAL
  Administered 2013-12-09 (×2): 5 mL via EPIDURAL

## 2013-12-09 MED ORDER — SODIUM CHLORIDE 0.9 % IJ SOLN
3.0000 mL | INTRAMUSCULAR | Status: DC | PRN
Start: 2013-12-09 — End: 2013-12-11

## 2013-12-09 MED ORDER — MORPHINE SULFATE 0.5 MG/ML IJ SOLN
INTRAMUSCULAR | Status: AC
  Filled 2013-12-09: qty 10

## 2013-12-09 MED ORDER — NALOXONE HCL 0.4 MG/ML IJ SOLN: 0.4000 mg | INTRAMUSCULAR | Status: DC | PRN

## 2013-12-09 MED ORDER — PHENYLEPHRINE 40 MCG/ML (10ML) SYRINGE FOR IV PUSH (FOR BLOOD PRESSURE SUPPORT)
PREFILLED_SYRINGE | INTRAVENOUS | Status: AC
  Filled 2013-12-09: qty 5

## 2013-12-09 MED ORDER — SCOPOLAMINE 1 MG/3DAYS TD PT72
MEDICATED_PATCH | TRANSDERMAL | Status: AC
  Administered 2013-12-09: 1.5 mg via TRANSDERMAL
  Filled 2013-12-09: qty 1

## 2013-12-09 MED ORDER — PRENATAL MULTIVITAMIN CH
1.0000 | ORAL_TABLET | Freq: Every day | ORAL | Status: DC
  Administered 2013-12-09 – 2013-12-11 (×3): 1 via ORAL
  Filled 2013-12-09 (×3): qty 1

## 2013-12-09 MED ORDER — FENTANYL CITRATE 0.05 MG/ML IJ SOLN
INTRAMUSCULAR | Status: AC
  Administered 2013-12-09: 50 ug via INTRAVENOUS
  Filled 2013-12-09: qty 2

## 2013-12-09 MED ORDER — MEPERIDINE HCL 25 MG/ML IJ SOLN: 6.2500 mg | INTRAMUSCULAR | Status: DC | PRN

## 2013-12-09 MED ORDER — LACTATED RINGERS IV SOLN
INTRAVENOUS | Status: DC | PRN
  Administered 2013-12-09 (×2): via INTRAVENOUS

## 2013-12-09 MED ORDER — NALOXONE HCL 1 MG/ML IJ SOLN
1.0000 ug/kg/h | INTRAVENOUS | Status: DC | PRN
  Filled 2013-12-09: qty 2

## 2013-12-09 MED ORDER — MEPERIDINE HCL 25 MG/ML IJ SOLN
INTRAMUSCULAR | Status: AC
  Filled 2013-12-09: qty 1

## 2013-12-09 MED ORDER — OXYCODONE-ACETAMINOPHEN 5-325 MG PO TABS
2.0000 | ORAL_TABLET | ORAL | Status: DC | PRN
  Administered 2013-12-10: 2 via ORAL
  Filled 2013-12-09: qty 2

## 2013-12-09 MED ORDER — DIBUCAINE 1 % RE OINT
1.0000 "application " | TOPICAL_OINTMENT | RECTAL | Status: DC | PRN
  Administered 2013-12-10: 1 via RECTAL
  Filled 2013-12-09: qty 28

## 2013-12-09 MED ORDER — ONDANSETRON HCL 4 MG/2ML IJ SOLN: 4.0000 mg | INTRAMUSCULAR | Status: DC | PRN

## 2013-12-09 MED ORDER — ONDANSETRON HCL 4 MG PO TABS: 4.0000 mg | ORAL_TABLET | ORAL | Status: DC | PRN

## 2013-12-09 MED ORDER — SIMETHICONE 80 MG PO CHEW: 80.0000 mg | CHEWABLE_TABLET | ORAL | Status: DC | PRN

## 2013-12-09 MED ORDER — LACTATED RINGERS IV SOLN
40.0000 [IU] | INTRAVENOUS | Status: DC | PRN
  Administered 2013-12-09: 40 [IU] via INTRAVENOUS

## 2013-12-09 MED ORDER — DIPHENHYDRAMINE HCL 25 MG PO CAPS: 25.0000 mg | ORAL_CAPSULE | Freq: Four times a day (QID) | ORAL | Status: DC | PRN

## 2013-12-09 MED ORDER — ONDANSETRON HCL 4 MG/2ML IJ SOLN
INTRAMUSCULAR | Status: AC
  Filled 2013-12-09: qty 2

## 2013-12-09 MED ORDER — DIPHENHYDRAMINE HCL 25 MG PO CAPS
25.0000 mg | ORAL_CAPSULE | ORAL | Status: DC | PRN
Start: 2013-12-09 — End: 2013-12-11

## 2013-12-09 MED ORDER — SIMETHICONE 80 MG PO CHEW
80.0000 mg | CHEWABLE_TABLET | Freq: Three times a day (TID) | ORAL | Status: DC
  Administered 2013-12-09 – 2013-12-11 (×6): 80 mg via ORAL
  Filled 2013-12-09 (×7): qty 1

## 2013-12-09 MED ORDER — KETOROLAC TROMETHAMINE 30 MG/ML IJ SOLN: 30.0000 mg | Freq: Four times a day (QID) | INTRAMUSCULAR | Status: AC | PRN

## 2013-12-09 MED ORDER — ZOLPIDEM TARTRATE 5 MG PO TABS: 5.0000 mg | ORAL_TABLET | Freq: Every evening | ORAL | Status: DC | PRN

## 2013-12-09 MED ORDER — DEXTROSE 5 % IV SOLN
3.0000 g | INTRAVENOUS | Status: AC
  Administered 2013-12-09: 3 g via INTRAVENOUS
  Filled 2013-12-09: qty 3000

## 2013-12-09 MED ORDER — FENTANYL CITRATE 0.05 MG/ML IJ SOLN
25.0000 ug | INTRAMUSCULAR | Status: DC | PRN
  Administered 2013-12-09: 50 ug via INTRAVENOUS

## 2013-12-09 MED ORDER — MENTHOL 3 MG MT LOZG: 1.0000 | LOZENGE | OROMUCOSAL | Status: DC | PRN

## 2013-12-09 MED ORDER — LACTATED RINGERS IV SOLN: INTRAVENOUS | Status: DC

## 2013-12-09 MED ORDER — IBUPROFEN 600 MG PO TABS
600.0000 mg | ORAL_TABLET | Freq: Four times a day (QID) | ORAL | Status: DC
  Administered 2013-12-09 – 2013-12-11 (×10): 600 mg via ORAL
  Filled 2013-12-09 (×10): qty 1

## 2013-12-09 MED ORDER — MEPERIDINE HCL 25 MG/ML IJ SOLN
INTRAMUSCULAR | Status: DC | PRN
  Administered 2013-12-09 (×2): 12.5 mg via INTRAVENOUS

## 2013-12-09 MED ORDER — LANOLIN HYDROUS EX OINT: 1.0000 "application " | TOPICAL_OINTMENT | CUTANEOUS | Status: DC | PRN

## 2013-12-09 MED ORDER — LIDOCAINE-EPINEPHRINE (PF) 2 %-1:200000 IJ SOLN
INTRAMUSCULAR | Status: AC
  Filled 2013-12-09: qty 20

## 2013-12-09 MED ORDER — FENTANYL CITRATE 0.05 MG/ML IJ SOLN
INTRAMUSCULAR | Status: DC | PRN
  Administered 2013-12-09 (×2): 50 ug via INTRAVENOUS

## 2013-12-09 MED ORDER — SIMETHICONE 80 MG PO CHEW
80.0000 mg | CHEWABLE_TABLET | ORAL | Status: DC
  Administered 2013-12-09 – 2013-12-11 (×2): 80 mg via ORAL
  Filled 2013-12-09 (×2): qty 1

## 2013-12-09 MED ORDER — METOCLOPRAMIDE HCL 5 MG/ML IJ SOLN
10.0000 mg | Freq: Three times a day (TID) | INTRAMUSCULAR | Status: DC | PRN
Start: 2013-12-09 — End: 2013-12-11

## 2013-12-09 MED ORDER — OXYTOCIN 40 UNITS IN LACTATED RINGERS INFUSION - SIMPLE MED: 62.5000 mL/h | INTRAVENOUS | Status: AC

## 2013-12-09 MED ORDER — SENNOSIDES-DOCUSATE SODIUM 8.6-50 MG PO TABS
2.0000 | ORAL_TABLET | ORAL | Status: DC
  Administered 2013-12-09 – 2013-12-11 (×2): 2 via ORAL
  Filled 2013-12-09 (×2): qty 2

## 2013-12-09 MED ORDER — KETOROLAC TROMETHAMINE 60 MG/2ML IM SOLN: 60.0000 mg | Freq: Once | INTRAMUSCULAR | Status: DC | PRN

## 2013-12-09 MED ORDER — TETANUS-DIPHTH-ACELL PERTUSSIS 5-2.5-18.5 LF-MCG/0.5 IM SUSP: 0.5000 mL | Freq: Once | INTRAMUSCULAR | Status: DC

## 2013-12-09 MED ORDER — DIPHENHYDRAMINE HCL 50 MG/ML IJ SOLN: 12.5000 mg | INTRAMUSCULAR | Status: DC | PRN

## 2013-12-09 SURGICAL SUPPLY — 30 items
BLADE SURG 10 STRL SS (BLADE) ×6 IMPLANT
CLAMP CORD UMBIL (MISCELLANEOUS) IMPLANT
CLOTH BEACON ORANGE TIMEOUT ST (SAFETY) ×3 IMPLANT
DRAIN JACKSON PRT FLT 7MM (DRAIN) IMPLANT
DRAPE LG THREE QUARTER DISP (DRAPES) IMPLANT
DRSG OPSITE POSTOP 4X10 (GAUZE/BANDAGES/DRESSINGS) ×3 IMPLANT
DURAPREP 26ML APPLICATOR (WOUND CARE) ×3 IMPLANT
ELECT REM PT RETURN 9FT ADLT (ELECTROSURGICAL) ×3
ELECTRODE REM PT RTRN 9FT ADLT (ELECTROSURGICAL) ×1 IMPLANT
EVACUATOR SILICONE 100CC (DRAIN) IMPLANT
EXTRACTOR VACUUM M CUP 4 TUBE (SUCTIONS) IMPLANT
EXTRACTOR VACUUM M CUP 4' TUBE (SUCTIONS)
GLOVE BIO SURGEON STRL SZ7 (GLOVE) ×3 IMPLANT
GLOVE BIOGEL PI IND STRL 7.0 (GLOVE) ×1 IMPLANT
GLOVE BIOGEL PI INDICATOR 7.0 (GLOVE) ×2
GOWN STRL REUS W/TWL LRG LVL3 (GOWN DISPOSABLE) ×6 IMPLANT
KIT ABG SYR 3ML LUER SLIP (SYRINGE) ×2 IMPLANT
NDL HYPO 25X5/8 SAFETYGLIDE (NEEDLE) ×1 IMPLANT
NEEDLE HYPO 25X5/8 SAFETYGLIDE (NEEDLE) ×3 IMPLANT
NS IRRIG 1000ML POUR BTL (IV SOLUTION) ×3 IMPLANT
PACK C SECTION WH (CUSTOM PROCEDURE TRAY) ×3 IMPLANT
PAD OB MATERNITY 4.3X12.25 (PERSONAL CARE ITEMS) ×3 IMPLANT
RTRCTR C-SECT PINK 25CM LRG (MISCELLANEOUS) ×3 IMPLANT
SUT MNCRL AB 0 CT1 27 (SUTURE) ×2 IMPLANT
SUT VIC AB 0 CTX 36 (SUTURE) ×15
SUT VIC AB 0 CTX36XBRD ANBCTRL (SUTURE) ×5 IMPLANT
SUT VIC AB 4-0 KS 27 (SUTURE) ×3 IMPLANT
TOWEL OR 17X24 6PK STRL BLUE (TOWEL DISPOSABLE) ×3 IMPLANT
TRAY FOLEY CATH 14FR (SET/KITS/TRAYS/PACK) ×3 IMPLANT
WATER STERILE IRR 1000ML POUR (IV SOLUTION) ×1 IMPLANT

## 2013-12-09 NOTE — Lactation Note (Addendum)
This note was copied from the chart of Monica Ladaysha Soutar. Lactation Consultation Note    Initial consult with this mom and baby, now 8 hours old. Baby is term, 41 weeks, and weighs 6 lbs 3.1 oz. Baby has been 'clicking and dimpling" with latch. On exam, baby has a posterior short, thick frenulum, and an upper lip frenulum that extends to the gum line. Mom aware of my findings, and I explained that her pediatrician would talk to her more about this. i did notify Dr Carma Lair about my findings. i tried a 24 nipple shield ( 20 too small), the shield was too big for the baby - she did suck, but nothing was transferred. She was just riding on the nipple. I then attached her without the shield, mom's breast tissue very compressible with lots of easily expressed colostrum. The baby is eager to latch, strong suckles with clicking and dimpling. Mom advised to make rn/lactation aware of any nipple tenderness. DEP set up by Richmond Campbell, rn, and mow shown how to use.  Lactation serivces reviewed with mom also.  Patient Name: Monica Harding [id number] Date: 12/09/2013 Reason for consult: Initial assessment;Difficult latch   Maternal Data Formula Feeding for Exclusion: No Has patient been taught Hand Expression?: Yes Does the patient have breastfeeding experience prior to this delivery?: No  Feeding Feeding Type: Breast Fed  LATCH Score/Interventions Latch: Repeated attempts needed to sustain latch, nipple held in mouth throughout feeding, stimulation needed to elicit sucking reflex. Intervention(s): Adjust position;Assist with latch;Breast compression (tried 24 nipple shiled - no transfer - just nipple cuking, Mom has soft, compressible tissue - deeper latch without shiled)  Audible Swallowing: A few with stimulation Intervention(s): Skin to skin;Hand expression  Type of Nipple: Everted at rest and after stimulation  Comfort (Breast/Nipple): Soft / non-tender     Hold (Positioning): Assistance needed  to correctly position infant at breast and maintain latch. Intervention(s): Breastfeeding basics reviewed;Support Pillows;Position options;Skin to skin (cropss cradle done )  LATCH Score: 7  Lactation Tools Discussed/Used     Consult Status Consult Status: Follow-up Date: 12/10/13 Follow-up type: In-patient    Alfred Levins 12/09/2013, 9:59 AM

## 2013-12-09 NOTE — Op Note (Signed)
Attestation of Attending Supervision of Fellow: Evaluation and management procedures were performed by the Fellow under my supervision and collaboration. I have reviewed the Fellow's note and chart, and I agree with the management and plan.  I was present for the entire procedure.

## 2013-12-09 NOTE — Transfer of Care (Signed)
Immediate Anesthesia Transfer of Care Note  Patient: Monica Harding  Procedure(s) Performed: Procedure(s): CESAREAN SECTION (N/A)  Patient Location: PACU  Anesthesia Type:Epidural  Level of Consciousness: awake, alert , oriented and patient cooperative  Airway & Oxygen Therapy: Patient Spontanous Breathing and Patient connected to nasal cannula oxygen  Post-op Assessment: Report given to PACU RN and Post -op Vital signs reviewed and stable  Post vital signs: Reviewed and stable  Complications: No apparent anesthesia complications

## 2013-12-09 NOTE — Anesthesia Postprocedure Evaluation (Signed)
  Anesthesia Post-op Note  Patient: Monica Harding  Procedure(s) Performed: Procedure(s): CESAREAN SECTION (N/A)  Patient Location: Mother/Baby  Anesthesia Type:Epidural  Level of Consciousness: awake, alert  and oriented  Airway and Oxygen Therapy: Patient Spontanous Breathing  Post-op Pain: mild  Post-op Assessment: Post-op Vital signs reviewed  Post-op Vital Signs: Reviewed and stable  Last Vitals:  Filed Vitals:   12/09/13 1429  BP: 95/56  Pulse: 79  Temp:   Resp: 18    Complications: No apparent anesthesia complications

## 2013-12-09 NOTE — Progress Notes (Signed)
Monica Harding is a 23 y.o. G1P0 at [redacted]w[redacted]d admitted for induction of labor due to [facility] lag.  Subjective: Sleeping, comfortable  Objective: BP 111/61  Pulse 85  Temp(Src) 97.9 F (36.6 C) (Oral)  Resp 18  Ht  (1.727 m)  Wt 273 lb (123.832 kg)  BMI 41.52 kg/m2  SpO2 98%  LMP 12/27/2012      FHT:  FHR: 150s bpm, variability: minimal ,  accelerations:  Absent,  decelerations:  Recurrent late decels UC:   q2-22min SVE:   Dilation: 3.5 Effacement (%): 100 Station: -1 Exam by:: [employer]: Lab Results  Component Value Date   WBC 10.0 12/08/2013   HGB 12.1 12/08/2013   HCT 35.8* 12/08/2013   MCV 90.9 12/08/2013   PLT 134* 12/08/2013    Assessment / Plan: IOL for AC lag  Labor: IOL without much progress Preeclampsia:  no signs or symptoms of toxicity Fetal Wellbeing:  Category II with continued recurrent lates off pitocin Pain Control:  Fentanyl I/D:  n/a Anticipated MOD:  Cesarean section planned, risks and benefits discussed with patient, Dr. Penne Lash present for discussion  The risks of cesarean section discussed with the patient included but were not limited to: bleeding which may require transfusion or reoperation; infection which may require antibiotics; injury to bowel, bladder, ureters or other surrounding organs; injury to the fetus; need for additional procedures including hysterectomy in the event of a life-threatening hemorrhage; placental abnormalities wth subsequent pregnancies, incisional problems, thromboembolic phenomenon and other postoperative/anesthesia complications. The patient concurred with the proposed plan, giving informed written consent for the procedure.   She will remain NPO for procedure. Anesthesia and OR aware. Preoperative prophylactic antibiotics and SCDs ordered on call to the OR.  To OR when ready.     ACOSTA,KRISTY ROCIO 12/09/2013, 12:14 AM  Present for consent and agree with the above.  Non reassuring fetal status that did not respond  to pitocin.  Ancef for surgical prophylaxis.

## 2013-12-09 NOTE — Anesthesia Postprocedure Evaluation (Signed)
  Anesthesia Post-op Note  Patient: Monica Harding  Procedure(s) Performed: Procedure(s) (LRB): CESAREAN SECTION (N/A)  Patient Location: PACU  Anesthesia Type: Epidural  Level of Consciousness: awake and alert   Airway and Oxygen Therapy: Patient Spontanous Breathing  Post-op Pain: mild  Post-op Assessment: Post-op Vital signs reviewed, Patient's Cardiovascular Status Stable, Respiratory Function Stable, Patent Airway and No signs of Nausea or vomiting  Last Vitals:  Filed Vitals:   12/09/13 0203  BP: 142/74  Pulse: 86  Temp: 37.8 C  Resp: 23    Post-op Vital Signs: stable   Complications: No apparent anesthesia complications

## 2013-12-09 NOTE — Op Note (Signed)
[place] PROCEDURE DATE: 12/09/2013  PREOPERATIVE DIAGNOSES: Intrauterine pregnancy at [redacted]w[redacted]d weeks gestation; non-reassuring fetal status  POSTOPERATIVE DIAGNOSES: The same  PROCEDURE: Repeat Low Transverse Cesarean Section  SURGEON:  Dr. Elsie Lincoln  ASSISTANT:  Dr. Fredirick Lathe  INDICATIONS: Monica Harding is a 22 y.o. G1P1001 at [redacted]w[redacted]d here for cesarean section secondary to the indications listed under preoperative diagnoses including recurrent late decelerations even when pitocin discontinued at 3.5/90/-1; please see preoperative note for further details.  The risks of cesarean section were discussed with the patient including but were not limited to: bleeding which may require transfusion or reoperation; infection which may require antibiotics; injury to bowel, bladder, ureters or other surrounding organs; injury to the fetus; need for additional procedures including hysterectomy in the event of a life-threatening hemorrhage; placental abnormalities wth subsequent pregnancies, incisional problems, thromboembolic phenomenon and other postoperative/anesthesia complications.   The patient concurred with the proposed plan, giving informed written consent for the procedure.    FINDINGS:  Viable female infant in cephalic presentation.  Apgars 8 and 9.  Clear amniotic fluid.  Intact placenta, three vessel cord.  Normal uterus, fallopian tubes and ovaries bilaterally.  ANESTHESIA: Epidural INTRAVENOUS FLUIDS: 1150 ml ESTIMATED BLOOD LOSS: 800 ml URINE OUTPUT:  100 ml SPECIMENS: Placenta sent to pathology COMPLICATIONS: None immediate  PROCEDURE IN DETAIL:  The patient preoperatively received intravenous antibiotics and had sequential compression devices applied to her lower extremities.  She was then taken to the operating room where the epidural anesthesia was dosed up to surgical level and was found to be adequate. She was then placed in a dorsal supine position with a leftward  tilt, and prepped and draped in a sterile manner.  A foley catheter was placed into her bladder and attached to constant gravity.  After an adequate timeout was performed, a Pfannenstiel skin incision was made with scalpel and carried through to the underlying layer of fascia with bovi. The fascia was incised in the midline bluntly, and this incision was extended bilaterally using the Mayo scissors.  Kocher clamps were applied to the superior aspect of the fascial incision and the underlying rectus muscles were dissected off bluntly and then with mayo scissors. A similar process was carried out on the inferior aspect of the fascial incision. The rectus muscles were separated in the midline bluntly and the peritoneum was entered bluntly. The rectus muscles were then transected with bovi for added exposure. Attention was turned to the lower uterine segment where a low transverse hysterotomy was made with a scalpel and extended bilaterally bluntly.  The infant was successfully delivered, the cord was clamped and cut and the infant was handed over to awaiting neonatology team. Uterine massage was then administered, and the placenta delivered intact with a three-vessel cord. The uterus was then cleared of clot and debris.  The hysterotomy was closed with 0 Vicryl in a running locked fashion, and an imbricating layer was also placed with 0 Vicryl. The pelvis was cleared of all clot and debris. Hemostasis was confirmed on all surfaces.  Serafilm placed over the hysterotomy.  The fascia was then closed using 0 Vicryl in a running fashion. The peritoneum and rectus muscles were noted to be hemostatic.  The fascia was closed with 0-Vicryl in a running fashion with good restoration of anatomy.  The subcutaneus tissue was copiously irrigated and repproximated with 2-0 plain gut interrupted stitches. The skin was closed with a 4-0 Vicryl subcuticular stitch. The patient tolerated the procedure well.  Sponge, lap, instrument and  needle counts were correct x 2.  She was taken to the recovery room in stable condition.   Perry Mount, MD 1:53 AM

## 2013-12-09 NOTE — Addendum Note (Signed)
Addendum created 12/09/13 1500 by Armanda Heritage, CRNA   Modules edited: Notes Section   Notes Section:  File: [id number]

## 2013-12-10 NOTE — Progress Notes (Signed)
Subjective: Postpartum Day 1: Cesarean Delivery Patient reports incisional pain, tolerating PO and no problems voiding.  Pain controlled with current PRNs. Denies BM or flatus. Ambulating well throughout room.  Objective: Vital signs in last 24 hours: Temp:  [97.7 F (36.5 C)-98.7 F (37.1 C)] 98.2 F (36.8 C) (09/06 0619) Pulse Rate:  [72-99] 91 (09/06 0619) Resp:  [18] 18 (09/06 0619) BP: (95-133)/(54-68) 108/65 mmHg (09/06 0619) SpO2:  [99 %-100 %] 100 % (09/06 1610)  Physical Exam:  General: alert, cooperative and appears stated age Lochia: appropriate Uterine Fundus: firm Incision: healing well DVT Evaluation: No evidence of DVT seen on physical exam. Calf/Ankle trace edema is present to bilateral lower extremities   Recent Labs  12/08/13 1906 12/09/13 0613  HGB 12.1 11.1*  HCT 35.8* 33.5*    Assessment/Plan: Status post Cesarean section. Doing well postoperatively.  Discussed breastfeeding, normal NB weight gain and expected letdown Continue current care.  Monica Harding [facility] 12/10/2013, 7:57 AM  Pt seen and examined.  Agree with above. Ziggy Chanthavong H. 8:59 AM

## 2013-12-11 MED ORDER — ACETAMINOPHEN-CODEINE #3 300-30 MG PO TABS: 1.0000 | ORAL_TABLET | ORAL | Status: DC | PRN

## 2013-12-11 MED ORDER — IBUPROFEN 600 MG PO TABS: 600.0000 mg | ORAL_TABLET | Freq: Four times a day (QID) | ORAL | Status: DC

## 2013-12-11 NOTE — Discharge Instructions (Signed)

## 2013-12-11 NOTE — Discharge Summary (Signed)
Obstetric Discharge Summary Reason for Admission: induction of labor due to SGA infantHeather L Harding is a 23 y.o. female G1 @ 40.4wks by 25wk U/S presenting for IOL due to fetal AC lag noted on U/S @ 34wks & 37wks. She had a scan with nl AFI/dopplers @ 37wks showing 34%th EFW but persistent lagging AC, and it was recommended by MFM that she be induced tonight. Denies N/V/D; no leaking or bldg. Her preg has been followed by the [facility] and has been remarkable for 1) late prenatal care starting at 28wks 2) obesity 3) AC lag in fetal growth    Prenatal Procedures: ultrasound and . Intrapartum Procedures: cesarean: low cervical, transverse Intrauterine pregnancy at [redacted]w[redacted]d weeks gestation; non-reassuring fetal status     Postpartum Procedures: none Complications-Operative and Postpartum: none Hemoglobin  Date Value Ref Range Status  12/09/2013 11.1* 12.0 - 15.0 g/dL Final     HCT  Date Value Ref Range Status  12/09/2013 33.5* 36.0 - 46.0 % Final   CBC Latest Ref Rng 12/09/2013 12/08/2013 12/06/2013  WBC 4.0 - 10.5 K/uL 11.0(H) 10.0 6.5  Hemoglobin 12.0 - 15.0 g/dL 11.1(L) 12.1 12.9  Hematocrit 36.0 - 46.0 % 33.5(L) 35.8(L) 39.1  Platelets 150 - 400 K/uL 127(L) 134(L) 136(L)       Physical Exam:  General: alert, cooperative and no distress Lochia: appropriate Uterine Fundus: unable to feel Incision: healing well, no significant drainage, no dehiscence, no significant erythema DVT Evaluation: No evidence of DVT seen on physical exam.  Discharge Diagnoses: Term Pregnancy-delivered  Discharge Information: given info on mirena and nexplanon Date: 12/11/2013 Activity: pelvic rest Diet: routine Medications: Tylenol #3 and Ibuprofen Condition: stable Instructions: routine cesarean, BCoptions Discharge to: home Follow-up Information   Schedule an appointment as soon as possible for a visit with [facility]. (As needed, For wound re-check in 4-6 wk)       Newborn Data: Live born female   Birth Weight: 6 lb 3.1 oz (2809 g) APGAR: 8, 9  Home with mother.  Monica Harding V 12/11/2013, 7:39 AM

## 2013-12-11 NOTE — Progress Notes (Signed)
Subjective: Postpartum Day 2: Cesarean Delivery Patient reports tolerating PO, + flatus and no problems voiding.  Breast + bottle planned BC undecided, lengthy review given.  Objective: Vital signs in last 24 hours: Temp:  [97.6 F (36.4 C)] 97.6 F (36.4 C) (09/06 1825) Pulse Rate:  [85] 85 (09/06 1825) Resp:  [18] 18 (09/06 1825) BP: (136)/(79) 136/79 mmHg (09/06 1825)  Physical Exam:  General: alert, cooperative, no distress, morbidly obese and alert, ambulatory Lochia: appropriate Uterine Fundus: unable to feel Incision: healing well, no significant drainage, no dehiscence, no significant erythema DVT Evaluation: No evidence of DVT seen on physical exam.   Recent Labs  12/08/13 1906 12/09/13 0613  HGB 12.1 11.1*  HCT 35.8* 33.5*    Assessment/Plan: Status post Cesarean section. Doing well postoperatively.  Discharge home with standard precautions and return to clinic in 4-6 weeks.  Almus Woodham V 12/11/2013, 7:26 AM

## 2013-12-11 NOTE — Progress Notes (Signed)
Ur chart review completed.  

## 2013-12-12 ENCOUNTER — Encounter (HOSPITAL_COMMUNITY): Payer: Self-pay | Admitting: Obstetrics & Gynecology

## 2014-01-04 ENCOUNTER — Ambulatory Visit (HOSPITAL_COMMUNITY): Admission: RE | Admit: 2014-01-04 | Payer: MEDICAID | Source: Ambulatory Visit

## 2014-01-15 ENCOUNTER — Encounter: Payer: Self-pay | Admitting: Obstetrics & Gynecology

## 2014-01-15 ENCOUNTER — Ambulatory Visit (INDEPENDENT_AMBULATORY_CARE_PROVIDER_SITE_OTHER): Payer: Medicaid Other | Admitting: Obstetrics & Gynecology

## 2014-01-15 VITALS — BP 117/77 | HR 86 | Temp 98.4°F | Ht 68.0 in | Wt 250.5 lb

## 2014-01-15 DIAGNOSIS — Z9889 Other specified postprocedural states: Secondary | ICD-10-CM

## 2014-01-15 MED ORDER — NORGESTREL-ETHINYL ESTRADIOL 0.3-30 MG-MCG PO TABS: 1.0000 | ORAL_TABLET | Freq: Every day | ORAL | Status: DC

## 2014-01-15 NOTE — Progress Notes (Signed)
   Subjective:    Patient ID: Monica Harding, female    DOB: 06/04/1990, 23 y.o.   MRN: [id number]  HPI This  P1 is here after a PLTCS for non-reassuring fetal status. She is doing well. She is mostly bottlefeeding as the baby doesn't latch on. She is pumping twice daily. She has not had sex yet and plans to use OCPs. She reports normal bladder function and some constipation (still on percocet). Reassurance given. She denies pp depression and scored 0 on the test.   Review of Systems     Objective:   Physical Exam Incision- healed great       Assessment & Plan:  PP- doing well Contraception- lo ovral- start today. Back up for a month RTC 6 weeks for BP check Encouraged weight loss

## 2014-02-05 ENCOUNTER — Encounter: Payer: Self-pay | Admitting: Obstetrics & Gynecology

## 2014-02-26 ENCOUNTER — Telehealth: Payer: Self-pay | Admitting: Obstetrics and Gynecology

## 2014-02-26 NOTE — Telephone Encounter (Signed)
Called patient about missed appointment. Patent stated she no longer needed this appointment because she was not interested in the birth control because of too many health risk.

## 2015-02-12 IMAGING — US US OB FOLLOW-UP
1 series · 12 of 28 positions shown · non-contrast
Comparison: none

[Series 1: us ob follow up · 55 acquisitions, 12 frames shown]
[im 3/55]
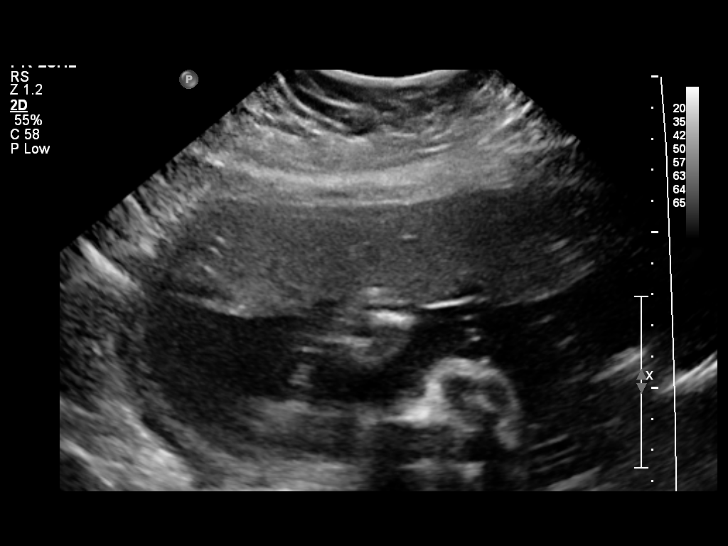
[im 7/55]
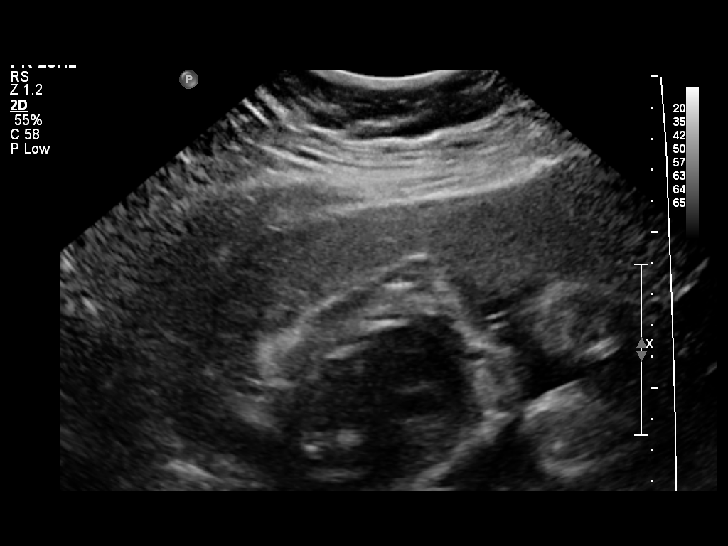
[im 11/55]
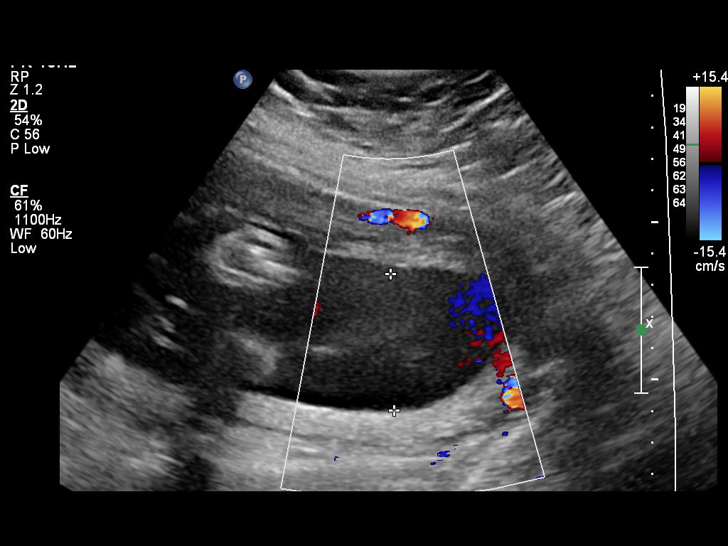
[im 17/55]
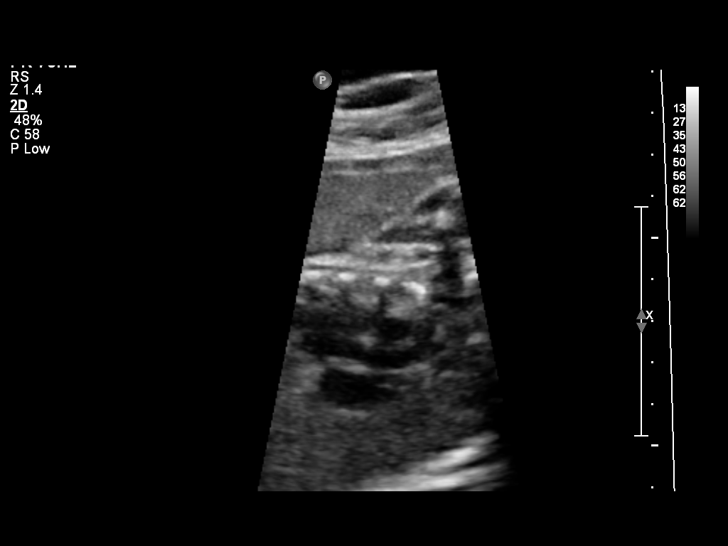
[im 21/55]
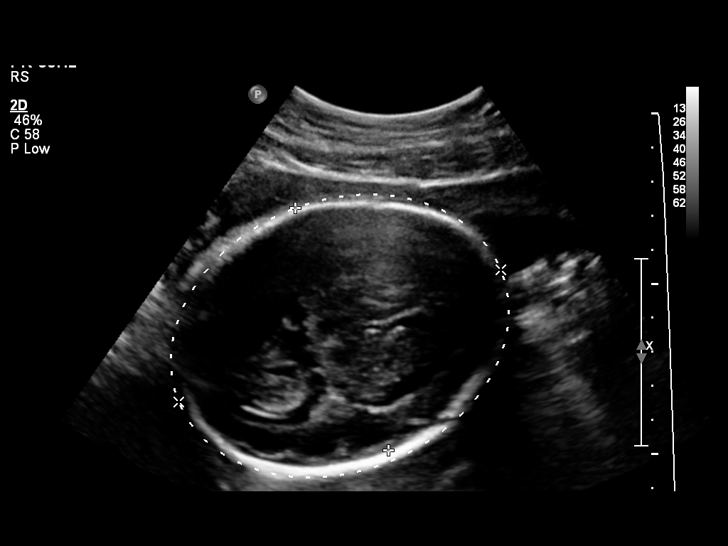
[im 25/55]
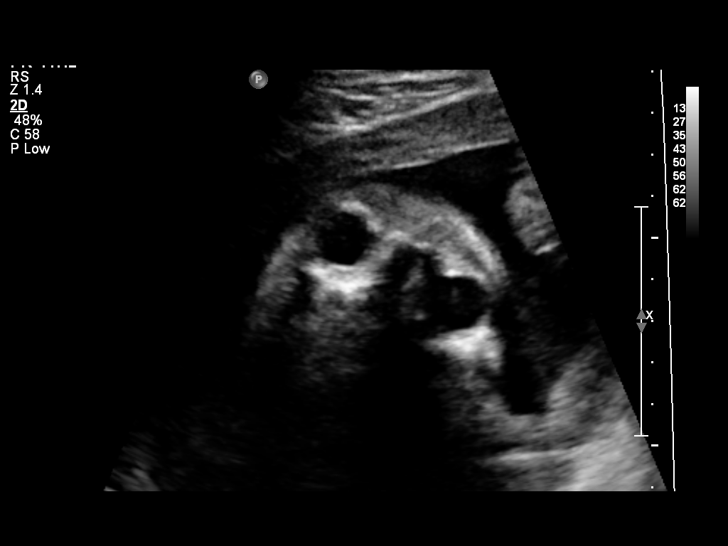
[im 31/55]
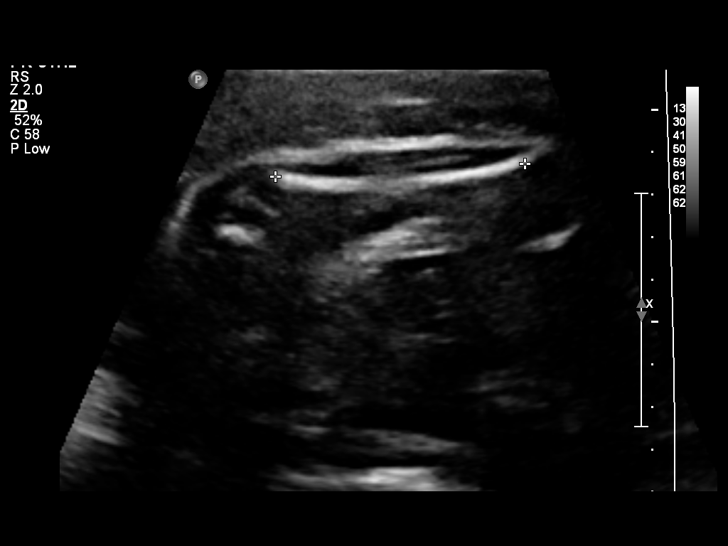
[im 35/55]
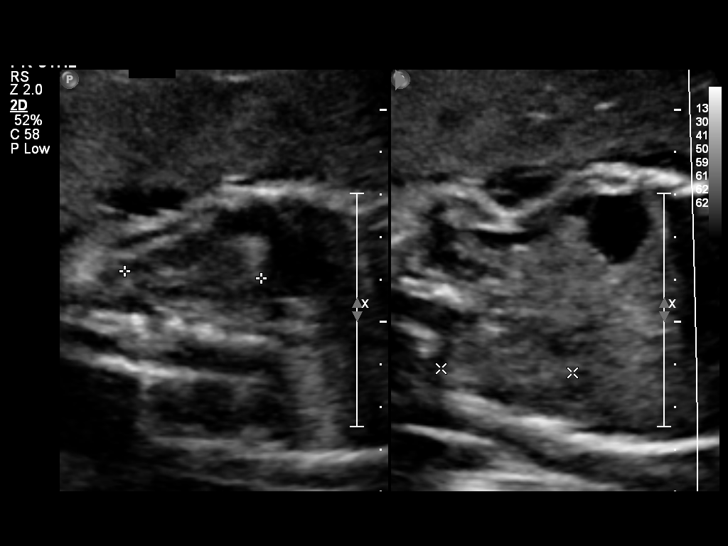
[im 39/55]
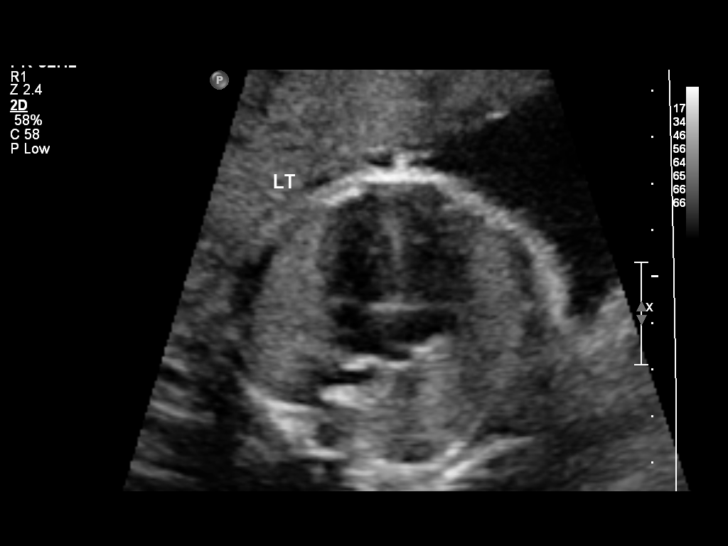
[im 45/55]
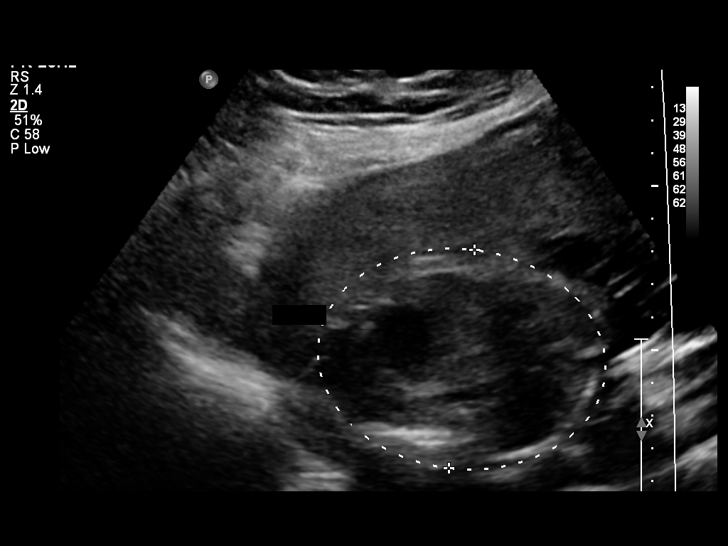
[im 49/55]
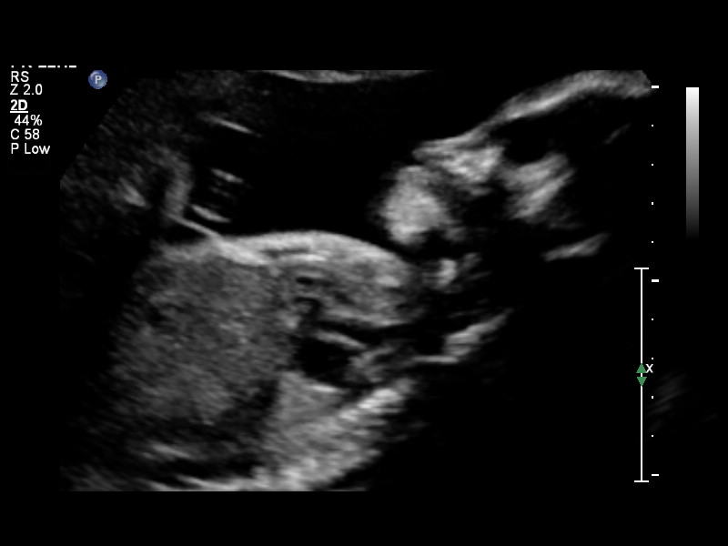
[im 53/55]
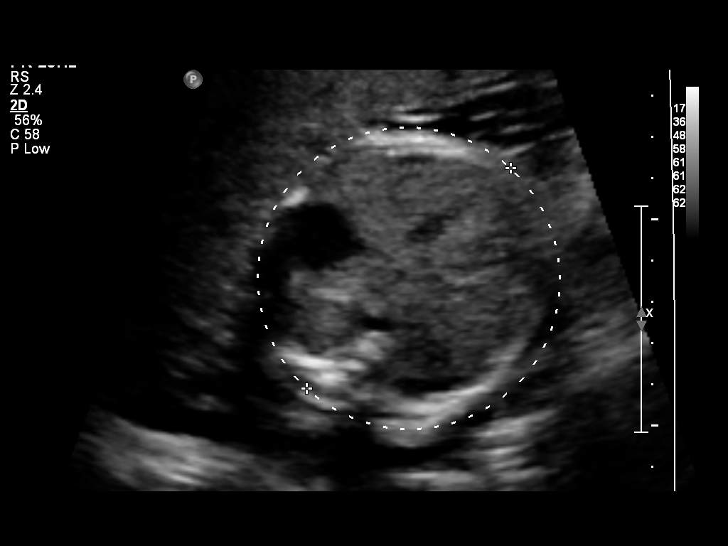

[12 of 28 positions shown; findings below may reference images not displayed]

OBSTETRICS REPORT
                      (Signed Final 09/22/2013 [DATE])

Service(s) Provided

 US OB FOLLOW UP                                       76816.1
Indications

 Maternal morbid obesity
 Unsure of LMP;  Establish Gestational [AGE]
 No or Little Prenatal Care
 Follow-up incomplete fetal anatomic evaluation
Fetal Evaluation

 Num Of Fetuses:    1
 Fetal Heart Rate:  150                          bpm
 Cardiac Activity:  Observed
 Presentation:      Cephalic
 Placenta:          Anterior, above cervical os
 P. Cord            Visualized, central
 Insertion:

 Amniotic Fluid
 AFI FV:      Subjectively within normal limits
 AFI Sum:     16.15   cm       58  %Tile     Larg Pckt:    4.32  cm
 RUQ:   4.05    cm   RLQ:    3.62   cm    LUQ:   4.32    cm   LLQ:    4.16   cm
Biometry

 BPD:     76.5  mm     G. Age:  30w 5d                CI:        71.66   70 - 86
                                                      FL/HC:      20.5   19.2 -

 HC:     287.7  mm     G. Age:  31w 4d       67  %    HC/AC:      1.19   0.99 -

 AC:       241  mm     G. Age:  28w 3d        9  %    FL/BPD:     77.0   71 - 87
 FL:      58.9  mm     G. Age:  30w 5d       60  %    FL/AC:      24.4   20 - 24
 HUM:     52.4  mm     G. Age:  30w 4d       64  %

 Est. FW:    6102  gm      3 lb 2 oz     49  %
Gestational Age

 LMP:           38w 3d        Date:  12/27/12                 EDD:   10/03/13
 U/S Today:     30w 2d                                        EDD:   11/29/13
 Best:          29w 6d     Det. By:  U/S (08/22/13)           EDD:   12/02/13
Anatomy
 Cranium:          Appears normal         Aortic Arch:      Not well visualized
 Fetal Cavum:      Previously seen        Ductal Arch:      Appears normal
 Ventricles:       Appears normal         Diaphragm:        Appears normal
 Choroid Plexus:   Previously seen        Stomach:          Appears normal
 Cerebellum:       Previously seen        Abdomen:          Appears normal
 Posterior Fossa:  Previously seen        Abdominal Wall:   Not well visualized
 Nuchal Fold:      Not applicable (>20    Cord Vessels:     Appears normal (3
                   wks GA)                                  vessel cord)
 Face:             Appears normal         Kidneys:          Appear normal
                   (orbits and profile)
 Lips:             Appears normal         Bladder:          Appears normal
 Heart:            Appears normal         Spine:            Previously seen
                   (4CH, axis, and
                   situs)
 RVOT:             Appears normal         Lower             Previously seen
                                          Extremities:
 LVOT:             Appears normal         Upper             Previously seen
                                          Extremities:

 Other:  Gender not well visualized. Heels visualized. Technically difficult due
         to maternal habitus and fetal position.
Cervix Uterus Adnexa

 Cervix:       Not visualized (advanced GA >82wks)
 Uterus:       No abnormality visualized.
 Cul De Sac:   No free fluid seen.
 Left Ovary:    Not visualized.
 Right Ovary:   Not visualized.
 Adnexa:     No abnormality visualized.
Impression

 SIUP at 29+6 weeks
 Normal interval anatomy; anatomic survey complete except
 for AA and CI
 Normal amniotic fluid volume
 Appropriate interval growth with EFW at the 49th %tile; AC at
 the 9th %tile
Recommendations

 Follow-up ultrasound for growth in 3 weeks

## 2016-03-02 ENCOUNTER — Encounter: Payer: Self-pay | Admitting: Family Medicine

## 2016-03-02 ENCOUNTER — Ambulatory Visit: Payer: Self-pay

## 2016-03-02 DIAGNOSIS — Z3201 Encounter for pregnancy test, result positive: Secondary | ICD-10-CM

## 2016-03-02 LAB — POCT PREGNANCY, URINE: PREG TEST UR: POSITIVE — AB

## 2016-03-02 NOTE — Progress Notes (Signed)
Patient presented to office today for a pregnancy test. Test confirms she is pregnant around 6 weeks. Patient medication have been reviewed. A letter of verification has been given to patient to apply for pregnancy medicaid.

## 2016-03-04 ENCOUNTER — Inpatient Hospital Stay (HOSPITAL_COMMUNITY): Payer: Medicaid Other

## 2016-03-04 ENCOUNTER — Encounter (HOSPITAL_COMMUNITY): Payer: Self-pay | Admitting: *Deleted

## 2016-03-04 ENCOUNTER — Inpatient Hospital Stay (HOSPITAL_COMMUNITY)
Admission: AD | Admit: 2016-03-04 | Discharge: 2016-03-04 | Disposition: A | Discharge status: Home / Self Care | Payer: Medicaid Other | Source: Ambulatory Visit | Attending: Obstetrics & Gynecology | Admitting: Obstetrics & Gynecology

## 2016-03-04 DIAGNOSIS — Z3A01 Less than 8 weeks gestation of pregnancy: Secondary | ICD-10-CM

## 2016-03-04 DIAGNOSIS — N939 Abnormal uterine and vaginal bleeding, unspecified: Secondary | ICD-10-CM | POA: Insufficient documentation

## 2016-03-04 DIAGNOSIS — Z79899 Other long term (current) drug therapy: Secondary | ICD-10-CM | POA: Diagnosis not present

## 2016-03-04 DIAGNOSIS — R109 Unspecified abdominal pain: Secondary | ICD-10-CM | POA: Insufficient documentation

## 2016-03-04 DIAGNOSIS — O9989 Other specified diseases and conditions complicating pregnancy, childbirth and the puerperium: Secondary | ICD-10-CM

## 2016-03-04 DIAGNOSIS — Z87891 Personal history of nicotine dependence: Secondary | ICD-10-CM | POA: Diagnosis not present

## 2016-03-04 HISTORY — DX: Chlamydial infection, unspecified: A74.9

## 2016-03-04 HISTORY — DX: Essential (primary) hypertension: I10

## 2016-03-04 LAB — HCG, QUANTITATIVE, PREGNANCY: hCG, Beta Chain, Quant, S: 674 m[IU]/mL — ABNORMAL HIGH (ref <5)

## 2016-03-04 LAB — ABO/RH: ABO/RH(D): O POS

## 2016-03-04 LAB — CBC
HCT: 33 % — ABNORMAL LOW (ref 36.0–46.0)
Hemoglobin: 10.7 g/dL — ABNORMAL LOW (ref 12.0–15.0)
MCH: 29.2 pg (ref 26.0–34.0)
MCHC: 32.4 g/dL (ref 30.0–36.0)
MCV: 89.9 fL (ref 78.0–100.0)
PLATELETS: 181 10*3/uL (ref 150–400)
RBC: 3.67 MIL/uL — ABNORMAL LOW (ref 3.87–5.11)
RDW: 14.1 % (ref 11.5–15.5)
WBC: 7.6 10*3/uL (ref 4.0–10.5)

## 2016-03-04 LAB — URINALYSIS, ROUTINE W REFLEX MICROSCOPIC
Glucose, UA: NEGATIVE mg/dL
Ketones, ur: NEGATIVE mg/dL
NITRITE: NEGATIVE
PH: 6 (ref 5.0–8.0)
PROTEIN: 100 mg/dL — AB
Specific Gravity, Urine: 1.025 (ref 1.005–1.030)

## 2016-03-04 LAB — URINE MICROSCOPIC-ADD ON

## 2016-03-04 LAB — WET PREP, GENITAL
CLUE CELLS WET PREP: NONE SEEN
Sperm: NONE SEEN
TRICH WET PREP: NONE SEEN
WBC, Wet Prep HPF POC: NONE SEEN
Yeast Wet Prep HPF POC: NONE SEEN

## 2016-03-04 NOTE — MAU Provider Note (Signed)
History   Patient Monica RubensHeather Harding is a G2P1001 here with complaints of vaginal bleeding since this morning. She felt some cramping last night, and then saw blood on her pad when she woke up at 10am.   CSN: [id number]   Arrival date and time: 03/04/16 1117   None     Chief Complaint  Patient presents with  . Abdominal Cramping  . Vaginal Bleeding   Abdominal Cramping  This is a new problem. The current episode started yesterday. The onset quality is sudden. The problem occurs intermittently. The pain is located in the suprapubic region. The pain is mild. The quality of the pain is cramping. The abdominal pain does not radiate. Pertinent negatives include no anorexia, arthralgias, belching, constipation, diarrhea, dysuria, fever, flatus, frequency, headaches, hematochezia, hematuria, melena, myalgias, nausea, vomiting or weight loss. Nothing aggravates the pain. The pain is relieved by nothing. She has tried nothing for the symptoms.  Vaginal Bleeding  The patient's primary symptoms include vaginal bleeding. The patient's pertinent negatives include no genital itching, genital lesions, genital odor, genital rash, missed menses, pelvic pain or vaginal discharge. This is a new problem. The current episode started today. The problem occurs intermittently. The problem has been unchanged. She is pregnant. Associated symptoms include abdominal pain and back pain. Pertinent negatives include no anorexia, chills, constipation, diarrhea, discolored urine, dysuria, fever, flank pain, frequency, headaches, hematuria, joint pain, joint swelling, nausea, painful intercourse, sore throat, urgency or vomiting. The vaginal discharge was bloody. The vaginal bleeding is spotting. She has not been passing clots. She has not been passing tissue. Nothing aggravates the symptoms. She has tried nothing for the symptoms.    OB History    Gravida Para Term Preterm AB Living   2 1 1     1    SAB TAB Ectopic Multiple  Live Births           1      Past Medical History:  Diagnosis Date  . Chlamydia   . Hypertension     Past Surgical History:  Procedure Laterality Date  . CESAREAN SECTION N/A 12/09/2013   Procedure: CESAREAN SECTION;  Surgeon: Lesly DukesKelly H Leggett, MD;  Location: [facility];  Service: Obstetrics;  Laterality: N/A;    History reviewed. No pertinent family history.  Social History  Substance Use Topics  . Smoking status: Former Games developermoker  . Smokeless tobacco: Never Used  . Alcohol use Yes     Comment: social-- before pregnant     Allergies: No Known Allergies  Prescriptions Prior to Admission  Medication Sig Dispense Refill Last Dose  . acetaminophen (TYLENOL) 500 MG tablet Take 1,000-1,500 mg by mouth every 6 (six) hours as needed for headache.    Not Taking  . acetaminophen-codeine (TYLENOL #3) 300-30 MG per tablet Take 1-2 tablets by mouth every 4 (four) hours as needed for moderate pain. (Patient not taking: Reported on 03/02/2016) 20 tablet 0 Not Taking  . ibuprofen (ADVIL,MOTRIN) 600 MG tablet Take 1 tablet (600 mg total) by mouth every 6 (six) hours. (Patient not taking: Reported on 03/02/2016) 30 tablet 0 Not Taking  . norgestrel-ethinyl estradiol (LO/OVRAL,CRYSELLE) 0.3-30 MG-MCG tablet Take 1 tablet by mouth daily. (Patient not taking: Reported on 03/02/2016) 1 Package 11 Not Taking  . pantoprazole (PROTONIX) 40 MG tablet Take 40 mg by mouth daily as needed (indigestion).   Not Taking  . Prenatal Vit-Fe Fumarate-FA (PRENATAL MULTIVITAMIN) TABS tablet Take 1 tablet by mouth daily at 12 noon.   Not Taking  Review of Systems  Constitutional: Negative for chills, fever and weight loss.  HENT: Negative.  Negative for sore throat.   Eyes: Negative.   Respiratory: Negative.   Cardiovascular: Negative.   Gastrointestinal: Positive for abdominal pain. Negative for anorexia, constipation, diarrhea, flatus, hematochezia, melena, nausea and vomiting.  Genitourinary: Positive for vaginal  bleeding. Negative for dysuria, flank pain, frequency, hematuria, missed menses, pelvic pain, urgency and vaginal discharge.  Musculoskeletal: Positive for back pain. Negative for arthralgias, joint pain and myalgias.  Skin: Negative.   Neurological: Negative.  Negative for headaches.  Endo/Heme/Allergies: Negative.    Physical Exam   Blood pressure 120/66, pulse 95, temperature 98.7 F (37.1 C), temperature source Oral, resp. rate 18, weight 102.4 kg (225 lb 12.8 oz), last menstrual period 01/17/2016, currently breastfeeding.  Physical Exam  Constitutional: She appears well-developed.  HENT:  Head: Normocephalic.  Neck: Normal range of motion.  Respiratory: Effort normal. No respiratory distress.  GI: Soft. She exhibits no distension and no mass. There is no tenderness. There is no rebound and no guarding.  Genitourinary: Uterus normal. Vaginal discharge found.  Genitourinary Comments: Scant red blood in the vagina; no lesions on external or internal genitalia. Cervix is pink and non-friable. No CMT. No odor or discharge.     MAU Course  Procedures  MDM -UA -CBC, ABO, quant -US to rule out ectopic  Assessment and Plan  US shows a intrauterine sac but no yolk sac; cannot definitively rule out ectopic pregnancy. Quant is 674. Patient to follow up with quant in the clinic in two days. Apppointment set for 8 am on 12/1. Discussed with Dr. Reather LittlerAnyanway.   Luna KitchensKathryn Joell Buerger CNM  Charlesetta GaribaldiKathryn Lorraine Christyanna Mckeon 03/04/2016, 12:29 PM

## 2016-03-04 NOTE — MAU Note (Signed)
Went to dr on Monday, +PT.  Woke up this morning and she was bleeding a lot like she had started a  Period.  Last night was cramping.

## 2016-03-04 NOTE — Discharge Instructions (Signed)
Ectopic Pregnancy °An ectopic pregnancy happens when a fertilized egg grows outside the uterus. A pregnancy cannot live outside of the uterus. This problem often happens in the fallopian tube. It is often caused by damage to the fallopian tube. °If this problem is found early, you may be treated with medicine. If your tube tears or bursts open (ruptures), you will bleed inside. This is an emergency. You will need surgery. Get help right away. °What are the signs or symptoms? °You may have normal pregnancy symptoms at first. These include: °· Missing your period. °· Feeling sick to your stomach (nauseous). °· Being tired. °· Having tender breasts. ° °Then, you may start to have symptoms that are not normal. These include: °· Pain with sex (intercourse). °· Bleeding from the vagina. This includes light bleeding (spotting). °· Belly (abdomen) or lower belly cramping or pain. This may be felt on one side. °· A fast heartbeat (pulse). °· Passing out (fainting) after going poop (bowel movement). ° °If your tube tears, you may have symptoms such as: °· Really bad pain in the belly or lower belly. This happens suddenly. °· Dizziness. °· Passing out. °· Shoulder pain. ° °Get help right away if: °You have any of these symptoms. This is an emergency. °This information is not intended to replace advice given to you by your health care provider. Make sure you discuss any questions you have with your health care provider. °Document Released: 06/19/2008 Document Revised: 08/29/2015 Document Reviewed: 11/02/2012 °Elsevier Interactive Patient Education © 2017 [employer] ° °

## 2016-03-05 LAB — GC/CHLAMYDIA PROBE AMP (~~LOC~~) NOT AT ARMC
Chlamydia: NEGATIVE
Neisseria Gonorrhea: NEGATIVE

## 2016-03-06 ENCOUNTER — Telehealth: Payer: Self-pay | Admitting: General Practice

## 2016-03-06 ENCOUNTER — Ambulatory Visit: Payer: Medicaid Other | Admitting: General Practice

## 2016-03-06 DIAGNOSIS — O3680X0 Pregnancy with inconclusive fetal viability, not applicable or unspecified: Secondary | ICD-10-CM

## 2016-03-06 LAB — HCG, QUANTITATIVE, PREGNANCY: hCG, Beta Chain, Quant, S: 276 m[IU]/mL — ABNORMAL HIGH (ref <5)

## 2016-03-06 NOTE — Progress Notes (Signed)
Patient here for stat bhcg today. Patient reports continued heavy bleeding with passing of clots. Patient reports some cramping as well. Spoke with Dr Debroah LoopArnold about patient's symptoms today. He states patient does not need to see a provider this morning. Can call patient with results in a couple hours. Patient informed & left call back number of [phone]. Results reviewed with Dr Macon LargeAnyanwu who agrees to likely SAB, patient should follow up with bhcg on Monday. Will call patient

## 2016-03-06 NOTE — Telephone Encounter (Signed)
Called patient with bhcg results. No answer- phone kept ringing. No voicemail. Called second time with no success. Called emergency contact, no answer or voicemail. Will try monday

## 2016-03-09 ENCOUNTER — Telehealth: Payer: Self-pay | Admitting: General Practice

## 2016-03-09 ENCOUNTER — Other Ambulatory Visit: Payer: Medicaid Other

## 2016-03-09 ENCOUNTER — Encounter: Payer: Self-pay | Admitting: General Practice

## 2016-03-09 DIAGNOSIS — Z3490 Encounter for supervision of normal pregnancy, unspecified, unspecified trimester: Secondary | ICD-10-CM

## 2016-03-09 NOTE — Telephone Encounter (Signed)
Called patient regarding stat bhcg levels from Friday 12/1- no answer. Left message stating we are trying to reach you with results & to schedule another lab draw. Please call us back. Will send letter

## 2016-03-10 LAB — HCG, QUANTITATIVE, PREGNANCY: hCG, Beta Chain, Quant, S: 52.1 m[IU]/mL — ABNORMAL HIGH

## 2016-03-16 ENCOUNTER — Telehealth: Payer: Self-pay | Admitting: General Practice

## 2016-03-16 NOTE — Telephone Encounter (Signed)
Per Dr Macon LargeAnyanwu, patient needs to return for bhcg this week to confirm resolution of pregnancy. Called patient, no answer- left message to call us back regarding results

## 2016-03-17 NOTE — Telephone Encounter (Signed)
Called patient and informed her of results and need for additional lab this week. Patient verbalized understanding and states she can come tomorrow at 10am. Patient asked if her blood work showed anything else. Told patient that was the only thing we ran. Patient verbalized understanding and had no questions

## 2016-03-18 ENCOUNTER — Other Ambulatory Visit: Payer: Medicaid Other

## 2016-03-18 DIAGNOSIS — O3680X0 Pregnancy with inconclusive fetal viability, not applicable or unspecified: Secondary | ICD-10-CM

## 2016-03-19 LAB — HCG, QUANTITATIVE, PREGNANCY: HCG, BETA CHAIN, QUANT, S: 2.1 m[IU]/mL

## 2016-03-23 ENCOUNTER — Telehealth: Payer: Self-pay | Admitting: *Deleted

## 2016-03-23 NOTE — Telephone Encounter (Signed)
Called Monica Harding and informed her that per Dr. Vergie LivingPickens her recent pregnancy hormone level is negative and she no longer requires additional testing for this hormone. She should expect a period within the next 4-6 weeks.  Monica Harding voiced understanding.

## 2016-11-30 ENCOUNTER — Ambulatory Visit (INDEPENDENT_AMBULATORY_CARE_PROVIDER_SITE_OTHER): Payer: Medicaid Other | Admitting: *Deleted

## 2016-11-30 ENCOUNTER — Encounter: Payer: Self-pay | Admitting: General Practice

## 2016-11-30 VITALS — BP 121/49 | HR 113

## 2016-11-30 DIAGNOSIS — Z3201 Encounter for pregnancy test, result positive: Secondary | ICD-10-CM

## 2016-11-30 DIAGNOSIS — Z3200 Encounter for pregnancy test, result unknown: Secondary | ICD-10-CM

## 2016-11-30 LAB — POCT PREGNANCY, URINE: Preg Test, Ur: POSITIVE — AB

## 2016-11-30 NOTE — Progress Notes (Addendum)
Here for pregnancy test which was positive. States LMP was 10/21/16. States sure of that date , states it came a few days early , month before was 09/26/16- states usually regular- usually around 23th each month. States July period lasted same as usual - about 5 days, If LMP is correct her EDD is 07/28/17 and makes her 5 w 5d. States had hydrocholrothiazide last about a week ago. States had topamax last about 3 weeks ago. Understands she should not take either of these or any new meds now that she is pregnant. Discussed with Vonzella Nipple, PA and instructed patient to not take either topamax or hydrochlorothazide. Will try to get her in for new ob in 2-3 weeks. Instructed patient to come to MAU for severe headache unrelieved by tylenol or visual changes. She voices understanding. Also c/o itching in perineal area, not vaginal and using hydrocortisone cream without much relief. Per provider may only use hydrocortisone cream for up to 2 weeks, may switch to nystatin over the counter cream for up to 2 weeks if needed.

## 2016-11-30 NOTE — Progress Notes (Signed)
Agree with plan of care as noted by RN. Advised no need for BP meds at this time since BP is normal today. Patient will be scheduled for HROB visit with MD around [redacted] weeks GA.   Marny Lowenstein, PA-C 11/30/2016 4:39 PM

## 2016-12-02 ENCOUNTER — Encounter: Payer: Self-pay | Admitting: *Deleted

## 2016-12-21 ENCOUNTER — Other Ambulatory Visit (HOSPITAL_COMMUNITY)
Admission: RE | Admit: 2016-12-21 | Discharge: 2016-12-21 | Disposition: A | Discharge status: Home / Self Care | Payer: Medicaid Other | Source: Ambulatory Visit | Attending: Obstetrics & Gynecology | Admitting: Obstetrics & Gynecology

## 2016-12-21 ENCOUNTER — Ambulatory Visit (INDEPENDENT_AMBULATORY_CARE_PROVIDER_SITE_OTHER): Payer: Medicaid Other | Admitting: Obstetrics & Gynecology

## 2016-12-21 ENCOUNTER — Encounter: Payer: Self-pay | Admitting: Obstetrics & Gynecology

## 2016-12-21 DIAGNOSIS — O0991 Supervision of high risk pregnancy, unspecified, first trimester: Secondary | ICD-10-CM | POA: Diagnosis present

## 2016-12-21 DIAGNOSIS — O0990 Supervision of high risk pregnancy, unspecified, unspecified trimester: Secondary | ICD-10-CM | POA: Insufficient documentation

## 2016-12-21 DIAGNOSIS — O10911 Unspecified pre-existing hypertension complicating pregnancy, first trimester: Secondary | ICD-10-CM

## 2016-12-21 DIAGNOSIS — Z113 Encounter for screening for infections with a predominantly sexual mode of transmission: Secondary | ICD-10-CM | POA: Diagnosis not present

## 2016-12-21 DIAGNOSIS — O10919 Unspecified pre-existing hypertension complicating pregnancy, unspecified trimester: Secondary | ICD-10-CM | POA: Diagnosis present

## 2016-12-21 DIAGNOSIS — O34219 Maternal care for unspecified type scar from previous cesarean delivery: Secondary | ICD-10-CM | POA: Insufficient documentation

## 2016-12-21 DIAGNOSIS — Z124 Encounter for screening for malignant neoplasm of cervix: Secondary | ICD-10-CM

## 2016-12-21 LAB — POCT URINALYSIS DIP (DEVICE)
Bilirubin Urine: NEGATIVE
GLUCOSE, UA: NEGATIVE mg/dL
Hgb urine dipstick: NEGATIVE
Ketones, ur: NEGATIVE mg/dL
LEUKOCYTES UA: NEGATIVE
NITRITE: NEGATIVE
Protein, ur: NEGATIVE mg/dL
SPECIFIC GRAVITY, URINE: 1.015 (ref 1.005–1.030)
UROBILINOGEN UA: 0.2 mg/dL (ref 0.0–1.0)
pH: 5.5 (ref 5.0–8.0)

## 2016-12-21 MED ORDER — DIPHENHYDRAMINE HCL 25 MG PO CAPS: 25.0000 mg | ORAL_CAPSULE | Freq: Four times a day (QID) | ORAL | 0 refills | Status: DC | PRN

## 2016-12-21 NOTE — Progress Notes (Signed)
  Subjective:    Monica Harding is a [id number] [redacted]w[redacted]d being seen today for her first obstetrical visit.  Her obstetrical history is significant for h/o CHTN currently off her medication with normal BP today. Patient does intend to breast feed. Pregnancy history fully reviewed.  Patient reports vaginal irritation and mild rash.  Vitals:   12/21/16 1250  BP: 114/68  Pulse: 74  Weight: 240 lb 12.8 oz (109.2 kg)    HISTORY: OB History  Gravida Para Term Preterm AB Living  SAB TAB Ectopic Multiple Live Births          1    # Outcome Date GA Lbr Len/2nd Weight Sex Delivery Anes PTL Lv  4 Current           3 AB 01/21/16 4d         2 Term 12/09/13 [redacted]w[redacted]d  6 lb 3.1 oz (2.809 kg) F CS-LTranv EPI  LIV  1 Gravida              Past Medical History:  Diagnosis Date  . Chlamydia   . Hypertension    Past Surgical History:  Procedure Laterality Date  . CESAREAN SECTION N/A 12/09/2013   Procedure: CESAREAN SECTION;  Surgeon: Lesly Dukes, MD;  Location: [facility];  Service: Obstetrics;  Laterality: N/A;   History reviewed. No pertinent family history.   Exam    Uterus:   9 weeks  Pelvic Exam:    Perineum: No Hemorrhoids   Vulva: normal   Vagina:  normal mucosa   pH:     Cervix: no lesions   Adnexa: normal adnexa   Bony Pelvis: average  System: Breast:  normal appearance, no masses or tenderness   Skin: normal coloration and turgor, no rashes    Neurologic: oriented, normal, normal mood   Extremities: normal strength, tone, and muscle mass   HEENT extra ocular movement intact and sclera clear, anicteric   Mouth/Teeth mucous membranes moist, pharynx normal without lesions and dental hygiene good   Neck supple and no masses   Cardiovascular: regular rate and rhythm, no murmurs or gallops   Respiratory:  appears well, vitals normal, no respiratory distress, acyanotic, normal RR, neck free of mass or lymphadenopathy, chest clear, no wheezing, crepitations, rhonchi,  normal symmetric air entry   Abdomen: soft, non-tender; bowel sounds normal; no masses,  no organomegaly   Urinary: urethral meatus normal      Assessment:    Pregnancy: [id number] Patient Active Problem List   Diagnosis Date Noted  . Supervision of high risk pregnancy, antepartum 12/21/2016  . Chronic hypertension during pregnancy, antepartum 12/21/2016  . H/O keloid of skin 11/02/2013  . Obesity 09/11/2013        Plan:     Initial labs drawn. Prenatal vitamins. Problem list reviewed and updated. Genetic Screening discussed First Screen: ordered.  Ultrasound discussed; fetal survey: 18+ weeks.  Follow up in 4 weeks. 50% of 30 min visit spent on counseling and coordination of care.  ASA after 12 weeks   Scheryl Darter 12/21/2016

## 2016-12-21 NOTE — Progress Notes (Signed)
Patient is here today as a New OB visit. Patient complain of vaginal itching that started since 8/1.

## 2016-12-21 NOTE — Patient Instructions (Signed)
First Trimester of Pregnancy The first trimester of pregnancy is from week 1 until the end of week 13 (months 1 through 3). A week after a sperm fertilizes an egg, the egg will implant on the wall of the uterus. This embryo will begin to develop into a baby. Genes from you and your partner will form the baby. The female genes will determine whether the baby will be a boy or a girl. At 6-8 weeks, the eyes and face will be formed, and the heartbeat can be seen on ultrasound. At the end of 12 weeks, all the baby's organs will be formed. Now that you are pregnant, you will want to do everything you can to have a healthy baby. Two of the most important things are to get good prenatal care and to follow your health care provider's instructions. Prenatal care is all the medical care you receive before the baby's birth. This care will help prevent, find, and treat any problems during the pregnancy and childbirth. Body changes during your first trimester Your body goes through many changes during pregnancy. The changes vary from woman to woman.  You may gain or lose a couple of pounds at first.  You may feel sick to your stomach (nauseous) and you may throw up (vomit). If the vomiting is uncontrollable, call your health care provider.  You may tire easily.  You may develop headaches that can be relieved by medicines. All medicines should be approved by your health care provider.  You may urinate more often. Painful urination may mean you have a bladder infection.  You may develop heartburn as a result of your pregnancy.  You may develop constipation because certain hormones are causing the muscles that push stool through your intestines to slow down.  You may develop hemorrhoids or swollen veins (varicose veins).  Your breasts may begin to grow larger and become tender. Your nipples may stick out more, and the tissue that surrounds them (areola) may become darker.  Your gums may bleed and may be  sensitive to brushing and flossing.  Dark spots or blotches (chloasma, mask of pregnancy) may develop on your face. This will likely fade after the baby is born.  Your menstrual periods will stop.  You may have a loss of appetite.  You may develop cravings for certain kinds of food.  You may have changes in your emotions from day to day, such as being excited to be pregnant or being concerned that something may go wrong with the pregnancy and baby.  You may have more vivid and strange dreams.  You may have changes in your hair. These can include thickening of your hair, rapid growth, and changes in texture. Some women also have hair loss during or after pregnancy, or hair that feels dry or thin. Your hair will most likely return to normal after your baby is born.  What to expect at prenatal visits During a routine prenatal visit:  You will be weighed to make sure you and the baby are growing normally.  Your blood pressure will be taken.  Your abdomen will be measured to track your baby's growth.  The fetal heartbeat will be listened to between weeks 10 and 14 of your pregnancy.  Test results from any previous visits will be discussed.  Your health care provider may ask you:  How you are feeling.  If you are feeling the baby move.  If you have had any abnormal symptoms, such as leaking fluid, bleeding, severe headaches,   or abdominal cramping.  If you are using any tobacco products, including cigarettes, chewing tobacco, and electronic cigarettes.  If you have any questions.  Other tests that may be performed during your first trimester include:  Blood tests to find your blood type and to check for the presence of any previous infections. The tests will also be used to check for low iron levels (anemia) and protein on red blood cells (Rh antibodies). Depending on your risk factors, or if you previously had diabetes during pregnancy, you may have tests to check for high blood  sugar that affects pregnant women (gestational diabetes).  Urine tests to check for infections, diabetes, or protein in the urine.  An ultrasound to confirm the proper growth and development of the baby.  Fetal screens for spinal cord problems (spina bifida) and Down syndrome.  HIV (human immunodeficiency virus) testing. Routine prenatal testing includes screening for HIV, unless you choose not to have this test.  You may need other tests to make sure you and the baby are doing well.  Follow these instructions at home: Medicines  Follow your health care provider's instructions regarding medicine use. Specific medicines may be either safe or unsafe to take during pregnancy.  Take a prenatal vitamin that contains at least 600 micrograms (mcg) of folic acid.  If you develop constipation, try taking a stool softener if your health care provider approves. Eating and drinking  Eat a balanced diet that includes fresh fruits and vegetables, whole grains, good sources of protein such as meat, eggs, or tofu, and low-fat dairy. Your health care provider will help you determine the amount of weight gain that is right for you.  Avoid raw meat and uncooked cheese. These carry germs that can cause birth defects in the baby.  Eating four or five small meals rather than three large meals a day may help relieve nausea and vomiting. If you start to feel nauseous, eating a few soda crackers can be helpful. Drinking liquids between meals, instead of during meals, also seems to help ease nausea and vomiting.  Limit foods that are high in fat and processed sugars, such as fried and sweet foods.  To prevent constipation: ? Eat foods that are high in fiber, such as fresh fruits and vegetables, whole grains, and beans. ? Drink enough fluid to keep your urine clear or pale yellow. Activity  Exercise only as directed by your health care provider. Most women can continue their usual exercise routine during  pregnancy. Try to exercise for 30 minutes at least 5 days a week. Exercising will help you: ? Control your weight. ? Stay in shape. ? Be prepared for labor and delivery.  Experiencing pain or cramping in the lower abdomen or lower back is a good sign that you should stop exercising. Check with your health care provider before continuing with normal exercises.  Try to avoid standing for long periods of time. Move your legs often if you must stand in one place for a long time.  Avoid heavy lifting.  Wear low-heeled shoes and practice good posture.  You may continue to have sex unless your health care provider tells you not to. Relieving pain and discomfort  Wear a good support bra to relieve breast tenderness.  Take warm sitz baths to soothe any pain or discomfort caused by hemorrhoids. Use hemorrhoid cream if your health care provider approves.  Rest with your legs elevated if you have leg cramps or low back pain.  If you develop   varicose veins in your legs, wear support hose. Elevate your feet for 15 minutes, 3-4 times a day. Limit salt in your diet. Prenatal care  Schedule your prenatal visits by the twelfth week of pregnancy. They are usually scheduled monthly at first, then more often in the last 2 months before delivery.  Write down your questions. Take them to your prenatal visits.  Keep all your prenatal visits as told by your health care provider. This is important. Safety  Wear your seat belt at all times when driving.  Make a list of emergency phone numbers, including numbers for family, friends, the hospital, and police and fire departments. General instructions  Ask your health care provider for a referral to a local prenatal education class. Begin classes no later than the beginning of month 6 of your pregnancy.  Ask for help if you have counseling or nutritional needs during pregnancy. Your health care provider can offer advice or refer you to specialists for help  with various needs.  Do not use hot tubs, steam rooms, or saunas.  Do not douche or use tampons or scented sanitary pads.  Do not cross your legs for long periods of time.  Avoid cat litter boxes and soil used by cats. These carry germs that can cause birth defects in the baby and possibly loss of the fetus by miscarriage or stillbirth.  Avoid all smoking, herbs, alcohol, and medicines not prescribed by your health care provider. Chemicals in these products affect the formation and growth of the baby.  Do not use any products that contain nicotine or tobacco, such as cigarettes and e-cigarettes. If you need help quitting, ask your health care provider. You may receive counseling support and other resources to help you quit.  Schedule a dentist appointment. At home, brush your teeth with a soft toothbrush and be gentle when you floss. Contact a health care provider if:  You have dizziness.  You have mild pelvic cramps, pelvic pressure, or nagging pain in the abdominal area.  You have persistent nausea, vomiting, or diarrhea.  You have a bad smelling vaginal discharge.  You have pain when you urinate.  You notice increased swelling in your face, hands, legs, or ankles.  You are exposed to fifth disease or chickenpox.  You are exposed to German measles (rubella) and have never had it. Get help right away if:  You have a fever.  You are leaking fluid from your vagina.  You have spotting or bleeding from your vagina.  You have severe abdominal cramping or pain.  You have rapid weight gain or loss.  You vomit blood or material that looks like coffee grounds.  You develop a severe headache.  You have shortness of breath.  You have any kind of trauma, such as from a fall or a car accident. Summary  The first trimester of pregnancy is from week 1 until the end of week 13 (months 1 through 3).  Your body goes through many changes during pregnancy. The changes vary from  woman to woman.  You will have routine prenatal visits. During those visits, your health care provider will examine you, discuss any test results you may have, and talk with you about how you are feeling. This information is not intended to replace advice given to you by your health care provider. Make sure you discuss any questions you have with your health care provider. Document Released: 03/17/2001 Document Revised: 03/04/2016 Document Reviewed: 03/04/2016 Elsevier Interactive Patient Education  2017 Elsevier   Inc[employer]  

## 2016-12-23 LAB — CYTOLOGY - PAP
Bacterial vaginitis: POSITIVE — AB
CANDIDA VAGINITIS: NEGATIVE
CHLAMYDIA, DNA PROBE: NEGATIVE
Neisseria Gonorrhea: NEGATIVE
Trichomonas: NEGATIVE

## 2016-12-24 LAB — HEMOGLOBINOPATHY EVALUATION

## 2016-12-24 LAB — OBSTETRIC PANEL, INCLUDING HIV
ANTIBODY SCREEN: NEGATIVE
BASOS: 0 %
Basophils Absolute: 0 10*3/uL (ref 0.0–0.2)
EOS (ABSOLUTE): 0 10*3/uL (ref 0.0–0.4)
EOS: 1 %
HEMATOCRIT: 38.4 % (ref 34.0–46.6)
HEMOGLOBIN: 11.9 g/dL (ref 11.1–15.9)
HIV SCREEN 4TH GENERATION: NONREACTIVE
Hepatitis B Surface Ag: NEGATIVE
Immature Grans (Abs): 0 10*3/uL (ref 0.0–0.1)
Immature Granulocytes: 0 %
Lymphocytes Absolute: 1.3 10*3/uL (ref 0.7–3.1)
Lymphs: 31 %
MCH: 30 pg (ref 26.6–33.0)
MCHC: 31 g/dL — AB (ref 31.5–35.7)
MCV: 97 fL (ref 79–97)
MONOS ABS: 0.3 10*3/uL (ref 0.1–0.9)
Monocytes: 7 %
NEUTROS ABS: 2.6 10*3/uL (ref 1.4–7.0)
Neutrophils: 61 %
Platelets: 187 10*3/uL (ref 150–379)
RBC: 3.97 x10E6/uL (ref 3.77–5.28)
RDW: 14.2 % (ref 12.3–15.4)
RPR Ser Ql: NONREACTIVE
Rh Factor: POSITIVE
Rubella Antibodies, IGG: 3.33 index (ref >0.99)
WBC: 4.3 10*3/uL (ref 3.4–10.8)

## 2016-12-24 LAB — URINE CULTURE, OB REFLEX

## 2016-12-24 LAB — CULTURE, OB URINE

## 2016-12-29 ENCOUNTER — Encounter: Payer: Self-pay | Admitting: Family Medicine

## 2016-12-29 ENCOUNTER — Encounter: Payer: Self-pay | Admitting: Obstetrics & Gynecology

## 2016-12-29 DIAGNOSIS — R87612 Low grade squamous intraepithelial lesion on cytologic smear of cervix (LGSIL): Secondary | ICD-10-CM | POA: Insufficient documentation

## 2017-01-13 ENCOUNTER — Encounter (HOSPITAL_COMMUNITY): Payer: Self-pay | Admitting: Obstetrics & Gynecology

## 2017-01-19 ENCOUNTER — Encounter (HOSPITAL_COMMUNITY): Payer: Self-pay

## 2017-01-19 ENCOUNTER — Ambulatory Visit (INDEPENDENT_AMBULATORY_CARE_PROVIDER_SITE_OTHER): Payer: Self-pay | Admitting: Obstetrics & Gynecology

## 2017-01-19 VITALS — BP 117/66 | HR 98 | Wt 249.0 lb

## 2017-01-19 DIAGNOSIS — N760 Acute vaginitis: Secondary | ICD-10-CM

## 2017-01-19 DIAGNOSIS — O0990 Supervision of high risk pregnancy, unspecified, unspecified trimester: Secondary | ICD-10-CM

## 2017-01-19 DIAGNOSIS — O10919 Unspecified pre-existing hypertension complicating pregnancy, unspecified trimester: Secondary | ICD-10-CM

## 2017-01-19 DIAGNOSIS — O99212 Obesity complicating pregnancy, second trimester: Secondary | ICD-10-CM

## 2017-01-19 DIAGNOSIS — O0992 Supervision of high risk pregnancy, unspecified, second trimester: Secondary | ICD-10-CM

## 2017-01-19 DIAGNOSIS — O34219 Maternal care for unspecified type scar from previous cesarean delivery: Secondary | ICD-10-CM

## 2017-01-19 DIAGNOSIS — O99210 Obesity complicating pregnancy, unspecified trimester: Secondary | ICD-10-CM

## 2017-01-19 DIAGNOSIS — R87612 Low grade squamous intraepithelial lesion on cytologic smear of cervix (LGSIL): Secondary | ICD-10-CM

## 2017-01-19 DIAGNOSIS — B9689 Other specified bacterial agents as the cause of diseases classified elsewhere: Secondary | ICD-10-CM

## 2017-01-19 DIAGNOSIS — O10912 Unspecified pre-existing hypertension complicating pregnancy, second trimester: Secondary | ICD-10-CM

## 2017-01-19 MED ORDER — PRENATAL VITAMINS 0.8 MG PO TABS: 1.0000 | ORAL_TABLET | Freq: Every day | ORAL | 12 refills | Status: DC

## 2017-01-19 MED ORDER — METRONIDAZOLE 0.75 % VA GEL: 1.0000 | Freq: Every day | VAGINAL | 1 refills | Status: DC

## 2017-01-19 NOTE — Progress Notes (Signed)
   PRENATAL VISIT NOTE  Subjective:  Monica Harding is a 26 y.o. G4P1011 at [redacted]w[redacted]d being seen today for ongoing prenatal care.  She is currently monitored for the following issues for this low-risk pregnancy and has Maternal obesity, antepartum; H/O keloid of skin; Supervision of high risk pregnancy, antepartum; Chronic hypertension during pregnancy, antepartum; History of cesarean delivery, antepartum; and LGSIL on Pap smear of cervix on her problem list.  Patient reports no complaints.  Contractions: Not present. Vag. Bleeding: None.  Movement: Absent. Denies leaking of fluid.   The following portions of the patient's history were reviewed and updated as appropriate: allergies, current medications, past family history, past medical history, past social history, past surgical history and problem list. Problem list updated.  Objective:   Vitals:   01/19/17 1402  BP: 117/66  Pulse: 98  Weight: 249 lb (112.9 kg)    Fetal Status: Fetal Heart Rate (bpm): 142   Movement: Absent     General:  Alert, oriented and cooperative. Patient is in no acute distress.  Skin: Skin is warm and dry. No rash noted.   Cardiovascular: Normal heart rate noted  Respiratory: Normal respiratory effort, no problems with respiration noted  Abdomen: Soft, gravid, appropriate for gestational age.  Pain/Pressure: Absent     Pelvic: Cervical exam deferred        Extremities: Normal range of motion.  Edema: None  Mental Status:  Normal mood and affect. Normal behavior. Normal judgment and thought content.   Assessment and Plan:  Pregnancy: G4P1011 at [redacted]w[redacted]d  1. Supervision of high risk pregnancy, antepartum  - Prenatal Multivit-Min-Fe-FA (PRENATAL VITAMINS) 0.8 MG tablet; Take 1 tablet by mouth daily.  Dispense: 30 tablet; Refill: 12  2. Chronic hypertension during pregnancy, antepartum No meds at present  3. Maternal obesity, antepartum  4. History of cesarean delivery, antepartum Pt does NOT want a  TOLAC. She is requesting a repeat c-section at 39 weeks   5. BV (bacterial vaginosis)  - metroNIDAZOLE (METROGEL) 0.75 % vaginal gel; Place 1 Applicatorful vaginally at bedtime. Apply one applicatorful to vagina at bedtime for 5 days  Dispense: 70 g; Refill: 1  6. LGSIL on Pap smear of cervix No HPV done. Repeat PP  Preterm labor symptoms and general obstetric precautions including but not limited to vaginal bleeding, contractions, leaking of fluid and fetal movement were reviewed in detail with the patient. Please refer to After Visit Summary for other counseling recommendations.  Return in about 4 weeks (around 02/16/2017).   Willodean Rosenthal, MD

## 2017-01-19 NOTE — Patient Instructions (Signed)

## 2017-01-21 ENCOUNTER — Ambulatory Visit (HOSPITAL_COMMUNITY)
Admission: RE | Admit: 2017-01-21 | Discharge: 2017-01-21 | Disposition: A | Discharge status: Home / Self Care | Payer: Medicaid Other | Source: Ambulatory Visit | Attending: Obstetrics & Gynecology | Admitting: Obstetrics & Gynecology

## 2017-01-21 ENCOUNTER — Encounter (HOSPITAL_COMMUNITY): Payer: Self-pay

## 2017-01-21 DIAGNOSIS — O131 Gestational [pregnancy-induced] hypertension without significant proteinuria, first trimester: Secondary | ICD-10-CM | POA: Diagnosis present

## 2017-01-21 DIAGNOSIS — O289 Unspecified abnormal findings on antenatal screening of mother: Secondary | ICD-10-CM | POA: Diagnosis not present

## 2017-01-21 DIAGNOSIS — Z3A13 13 weeks gestation of pregnancy: Secondary | ICD-10-CM | POA: Diagnosis not present

## 2017-01-21 DIAGNOSIS — Z3682 Encounter for antenatal screening for nuchal translucency: Secondary | ICD-10-CM | POA: Diagnosis not present

## 2017-01-21 DIAGNOSIS — O0991 Supervision of high risk pregnancy, unspecified, first trimester: Secondary | ICD-10-CM | POA: Insufficient documentation

## 2017-01-21 DIAGNOSIS — E669 Obesity, unspecified: Secondary | ICD-10-CM | POA: Insufficient documentation

## 2017-01-21 DIAGNOSIS — O0990 Supervision of high risk pregnancy, unspecified, unspecified trimester: Secondary | ICD-10-CM

## 2017-01-21 DIAGNOSIS — O99211 Obesity complicating pregnancy, first trimester: Secondary | ICD-10-CM | POA: Diagnosis present

## 2017-01-21 DIAGNOSIS — O99351 Diseases of the nervous system complicating pregnancy, first trimester: Secondary | ICD-10-CM | POA: Diagnosis not present

## 2017-01-21 DIAGNOSIS — O34219 Maternal care for unspecified type scar from previous cesarean delivery: Secondary | ICD-10-CM

## 2017-01-21 DIAGNOSIS — O10919 Unspecified pre-existing hypertension complicating pregnancy, unspecified trimester: Secondary | ICD-10-CM

## 2017-01-21 HISTORY — DX: Unspecified abnormal cytological findings in specimens from vagina: R87.629

## 2017-01-21 NOTE — Addendum Note (Signed)
Encounter addended by: Adam PhenixArnold, Renzo Vincelette G, MD on: 01/21/2017  4:20 PM<BR>    Actions taken: Problem List reviewed, Problem List modified

## 2017-01-22 ENCOUNTER — Encounter: Payer: Self-pay | Admitting: *Deleted

## 2017-02-02 ENCOUNTER — Other Ambulatory Visit (HOSPITAL_COMMUNITY): Payer: Self-pay

## 2017-02-17 ENCOUNTER — Ambulatory Visit (INDEPENDENT_AMBULATORY_CARE_PROVIDER_SITE_OTHER): Payer: Medicaid Other | Admitting: Advanced Practice Midwife

## 2017-02-17 ENCOUNTER — Encounter: Payer: Self-pay | Admitting: Advanced Practice Midwife

## 2017-02-17 VITALS — BP 129/94 | HR 102 | Wt 251.0 lb

## 2017-02-17 DIAGNOSIS — O0990 Supervision of high risk pregnancy, unspecified, unspecified trimester: Secondary | ICD-10-CM

## 2017-02-17 DIAGNOSIS — O10912 Unspecified pre-existing hypertension complicating pregnancy, second trimester: Secondary | ICD-10-CM

## 2017-02-17 DIAGNOSIS — O10919 Unspecified pre-existing hypertension complicating pregnancy, unspecified trimester: Secondary | ICD-10-CM

## 2017-02-17 DIAGNOSIS — O0992 Supervision of high risk pregnancy, unspecified, second trimester: Secondary | ICD-10-CM

## 2017-02-17 MED ORDER — ASPIRIN 81 MG PO CHEW: 81.0000 mg | CHEWABLE_TABLET | Freq: Every day | ORAL | 4 refills | Status: DC

## 2017-02-17 NOTE — Progress Notes (Signed)
Anatomy US scheduled for November 28th @ 1400.  Pt notified.

## 2017-02-17 NOTE — Progress Notes (Signed)
Denied flu shot today.

## 2017-02-17 NOTE — Progress Notes (Signed)
   PRENATAL VISIT NOTE  Subjective:  Monica Harding is a 26 y.o. G3P1011 at 4840w0d being seen today for ongoing prenatal care.  She is currently monitored for the following issues for this high-risk pregnancy and has Maternal obesity, antepartum; H/O keloid of skin; Supervision of high risk pregnancy, antepartum; Chronic hypertension during pregnancy, antepartum; History of cesarean delivery, antepartum; and LGSIL on Pap smear of cervix on their problem list.  Patient reports intermittent headaches.  Contractions: Not present. Vag. Bleeding: None.  Movement: Present. Denies leaking of fluid.   The following portions of the patient's history were reviewed and updated as appropriate: allergies, current medications, past family history, past medical history, past social history, past surgical history and problem list. Problem list updated.  Objective:   Vitals:   02/17/17 1300  BP: (!) 129/94  Pulse: (!) 102  Weight: 251 lb (113.9 kg)    Fetal Status: Fetal Heart Rate (bpm): 156   Movement: Present     General:  Alert, oriented and cooperative. Patient is in no acute distress.  Skin: Skin is warm and dry. No rash noted.   Cardiovascular: Normal heart rate noted  Respiratory: Normal respiratory effort, no problems with respiration noted  Abdomen: Soft, gravid, appropriate for gestational age.  Pain/Pressure: Absent     Pelvic: Cervical exam deferred        Extremities: Normal range of motion.  Edema: None  Mental Status:  Normal mood and affect. Normal behavior. Normal judgment and thought content.   Assessment and Plan:  Pregnancy: G3P1011 at 7240w0d 1. Supervision of high risk pregnancy, antepartum  - Prenatal Multivit-Min-Fe-FA (PRENATAL VITAMINS) 0.8 MG tablet; Take 1 tablet by mouth daily.  Dispense: 30 tablet; Refill: 12  2. Chronic hypertension during pregnancy, antepartum No meds at present, per Dr Marice Potterove recheck 1 wk. If elevated consider med First elevation today.  Previously on HCTZ nonpregnant  3. Maternal obesity, antepartum   Told to stop Phentermine/Topamax  4. History of cesarean delivery, antepartum Pt does NOT want a TOLAC. She is requesting a repeat c-section at 39 weeks, around 3/17  5. BV (bacterial vaginosis)  - metroNIDAZOLE (METROGEL) 0.75 % vaginal gel; Place 1 Applicatorful vaginally at bedtime. Apply one applicatorful to vagina at bedtime for 5 days  Dispense: 70 g; Refill: 1 Rx at prior visit  6. LGSIL on Pap smear of cervix No HPV done. Repeat PP    Preterm labor symptoms and general obstetric precautions including but not limited to vaginal bleeding, contractions, leaking of fluid and fetal movement were reviewed in detail with the patient. Please refer to After Visit Summary for other counseling recommendations.  RTO 1 weeks for BP check US for anatomy 1 week RTO 4 wks for visit  Monica Harding, CNM

## 2017-02-17 NOTE — Patient Instructions (Signed)
Second Trimester of Pregnancy The second trimester is from week 13 through week 28, month 4 through 6. This is often the time in pregnancy that you feel your best. Often times, morning sickness has lessened or quit. You may have more energy, and you may get hungry more often. Your unborn baby (fetus) is growing rapidly. At the end of the sixth month, he or she is about 9 inches long and weighs about 1 pounds. You will likely feel the baby move (quickening) between 18 and 20 weeks of pregnancy. Follow these instructions at home:  Avoid all smoking, herbs, and alcohol. Avoid drugs not approved by your doctor.  Do not use any tobacco products, including cigarettes, chewing tobacco, and electronic cigarettes. If you need help quitting, ask your doctor. You may get counseling or other support to help you quit.  Only take medicine as told by your doctor. Some medicines are safe and some are not during pregnancy.  Exercise only as told by your doctor. Stop exercising if you start having cramps.  Eat regular, healthy meals.  Wear a good support bra if your breasts are tender.  Do not use hot tubs, steam rooms, or saunas.  Wear your seat belt when driving.  Avoid raw meat, uncooked cheese, and liter boxes and soil used by cats.  Take your prenatal vitamins.  Take 1500-2000 milligrams of calcium daily starting at the 20th week of pregnancy until you deliver your baby.  Try taking medicine that helps you poop (stool softener) as needed, and if your doctor approves. Eat more fiber by eating fresh fruit, vegetables, and whole grains. Drink enough fluids to keep your pee (urine) clear or pale yellow.  Take warm water baths (sitz baths) to soothe pain or discomfort caused by hemorrhoids. Use hemorrhoid cream if your doctor approves.  If you have puffy, bulging veins (varicose veins), wear support hose. Raise (elevate) your feet for 15 minutes, 3-4 times a day. Limit salt in your diet.  Avoid heavy  lifting, wear low heals, and sit up straight.  Rest with your legs raised if you have leg cramps or low back pain.  Visit your dentist if you have not gone during your pregnancy. Use a soft toothbrush to brush your teeth. Be gentle when you floss.  You can have sex (intercourse) unless your doctor tells you not to.  Go to your doctor visits. Get help if:  You feel dizzy.  You have mild cramps or pressure in your lower belly (abdomen).  You have a nagging pain in your belly area.  You continue to feel sick to your stomach (nauseous), throw up (vomit), or have watery poop (diarrhea).  You have bad smelling fluid coming from your vagina.  You have pain with peeing (urination). Get help right away if:  You have a fever.  You are leaking fluid from your vagina.  You have spotting or bleeding from your vagina.  You have severe belly cramping or pain.  You lose or gain weight rapidly.  You have trouble catching your breath and have chest pain.  You notice sudden or extreme puffiness (swelling) of your face, hands, ankles, feet, or legs.  You have not felt the baby move in over an hour.  You have severe headaches that do not go away with medicine.  You have vision changes. This information is not intended to replace advice given to you by your health care provider. Make sure you discuss any questions you have with your health care   provider. Document Released: 06/17/2009 Document Revised: 08/29/2015 Document Reviewed: 05/24/2012 Elsevier Interactive Patient Education  2017 [employer] Inc.  

## 2017-02-20 LAB — AFP, SERUM, OPEN SPINA BIFIDA
AFP MOM: 0.43
AFP VALUE AFPOSL: 13.1 ng/mL
Gest. Age on Collection Date: 17 weeks
MATERNAL AGE AT EDD: 28.5 a
OSBR Risk 1 IN: 10000
Test Results:: NEGATIVE
WEIGHT: 251 [lb_av]

## 2017-02-24 ENCOUNTER — Ambulatory Visit: Payer: Medicaid Other

## 2017-03-03 ENCOUNTER — Ambulatory Visit (HOSPITAL_COMMUNITY)
Admission: RE | Admit: 2017-03-03 | Discharge: 2017-03-03 | Disposition: A | Discharge status: Home / Self Care | Payer: Medicaid Other | Source: Ambulatory Visit | Attending: Advanced Practice Midwife | Admitting: Advanced Practice Midwife

## 2017-03-03 ENCOUNTER — Encounter (HOSPITAL_COMMUNITY): Payer: Self-pay

## 2017-03-03 ENCOUNTER — Other Ambulatory Visit: Payer: Self-pay | Admitting: Advanced Practice Midwife

## 2017-03-03 DIAGNOSIS — Z3A19 19 weeks gestation of pregnancy: Secondary | ICD-10-CM

## 2017-03-03 DIAGNOSIS — O0992 Supervision of high risk pregnancy, unspecified, second trimester: Secondary | ICD-10-CM | POA: Insufficient documentation

## 2017-03-03 DIAGNOSIS — O139 Gestational [pregnancy-induced] hypertension without significant proteinuria, unspecified trimester: Secondary | ICD-10-CM | POA: Insufficient documentation

## 2017-03-03 DIAGNOSIS — O99212 Obesity complicating pregnancy, second trimester: Secondary | ICD-10-CM | POA: Diagnosis not present

## 2017-03-03 DIAGNOSIS — Z6837 Body mass index (BMI) 37.0-37.9, adult: Secondary | ICD-10-CM | POA: Insufficient documentation

## 2017-03-03 DIAGNOSIS — O0990 Supervision of high risk pregnancy, unspecified, unspecified trimester: Secondary | ICD-10-CM

## 2017-03-03 DIAGNOSIS — O34219 Maternal care for unspecified type scar from previous cesarean delivery: Secondary | ICD-10-CM

## 2017-03-03 DIAGNOSIS — Z363 Encounter for antenatal screening for malformations: Secondary | ICD-10-CM | POA: Insufficient documentation

## 2017-03-04 ENCOUNTER — Other Ambulatory Visit (HOSPITAL_COMMUNITY): Payer: Self-pay | Admitting: *Deleted

## 2017-03-04 DIAGNOSIS — O10919 Unspecified pre-existing hypertension complicating pregnancy, unspecified trimester: Secondary | ICD-10-CM

## 2017-03-17 ENCOUNTER — Ambulatory Visit (INDEPENDENT_AMBULATORY_CARE_PROVIDER_SITE_OTHER): Payer: Medicaid Other | Admitting: Obstetrics & Gynecology

## 2017-03-17 VITALS — BP 138/75 | HR 103 | Wt 254.1 lb

## 2017-03-17 DIAGNOSIS — O10912 Unspecified pre-existing hypertension complicating pregnancy, second trimester: Secondary | ICD-10-CM

## 2017-03-17 DIAGNOSIS — O0990 Supervision of high risk pregnancy, unspecified, unspecified trimester: Secondary | ICD-10-CM

## 2017-03-17 DIAGNOSIS — O10919 Unspecified pre-existing hypertension complicating pregnancy, unspecified trimester: Secondary | ICD-10-CM

## 2017-03-17 DIAGNOSIS — O0992 Supervision of high risk pregnancy, unspecified, second trimester: Secondary | ICD-10-CM

## 2017-03-17 LAB — POCT URINALYSIS DIP (DEVICE)
BILIRUBIN URINE: NEGATIVE
GLUCOSE, UA: NEGATIVE mg/dL
HGB URINE DIPSTICK: NEGATIVE
Ketones, ur: NEGATIVE mg/dL
Nitrite: NEGATIVE
Protein, ur: NEGATIVE mg/dL
SPECIFIC GRAVITY, URINE: 1.025 (ref 1.005–1.030)
UROBILINOGEN UA: 0.2 mg/dL (ref 0.0–1.0)
pH: 6 (ref 5.0–8.0)

## 2017-03-17 NOTE — Patient Instructions (Signed)
Second Trimester of Pregnancy The second trimester is from week 13 through week 28, month 4 through 6. This is often the time in pregnancy that you feel your best. Often times, morning sickness has lessened or quit. You may have more energy, and you may get hungry more often. Your unborn baby (fetus) is growing rapidly. At the end of the sixth month, he or she is about 9 inches long and weighs about 1 pounds. You will likely feel the baby move (quickening) between 18 and 20 weeks of pregnancy. Follow these instructions at home:  Avoid all smoking, herbs, and alcohol. Avoid drugs not approved by your doctor.  Do not use any tobacco products, including cigarettes, chewing tobacco, and electronic cigarettes. If you need help quitting, ask your doctor. You may get counseling or other support to help you quit.  Only take medicine as told by your doctor. Some medicines are safe and some are not during pregnancy.  Exercise only as told by your doctor. Stop exercising if you start having cramps.  Eat regular, healthy meals.  Wear a good support bra if your breasts are tender.  Do not use hot tubs, steam rooms, or saunas.  Wear your seat belt when driving.  Avoid raw meat, uncooked cheese, and liter boxes and soil used by cats.  Take your prenatal vitamins.  Take 1500-2000 milligrams of calcium daily starting at the 20th week of pregnancy until you deliver your baby.  Try taking medicine that helps you poop (stool softener) as needed, and if your doctor approves. Eat more fiber by eating fresh fruit, vegetables, and whole grains. Drink enough fluids to keep your pee (urine) clear or pale yellow.  Take warm water baths (sitz baths) to soothe pain or discomfort caused by hemorrhoids. Use hemorrhoid cream if your doctor approves.  If you have puffy, bulging veins (varicose veins), wear support hose. Raise (elevate) your feet for 15 minutes, 3-4 times a day. Limit salt in your diet.  Avoid heavy  lifting, wear low heals, and sit up straight.  Rest with your legs raised if you have leg cramps or low back pain.  Visit your dentist if you have not gone during your pregnancy. Use a soft toothbrush to brush your teeth. Be gentle when you floss.  You can have sex (intercourse) unless your doctor tells you not to.  Go to your doctor visits. Get help if:  You feel dizzy.  You have mild cramps or pressure in your lower belly (abdomen).  You have a nagging pain in your belly area.  You continue to feel sick to your stomach (nauseous), throw up (vomit), or have watery poop (diarrhea).  You have bad smelling fluid coming from your vagina.  You have pain with peeing (urination). Get help right away if:  You have a fever.  You are leaking fluid from your vagina.  You have spotting or bleeding from your vagina.  You have severe belly cramping or pain.  You lose or gain weight rapidly.  You have trouble catching your breath and have chest pain.  You notice sudden or extreme puffiness (swelling) of your face, hands, ankles, feet, or legs.  You have not felt the baby move in over an hour.  You have severe headaches that do not go away with medicine.  You have vision changes. This information is not intended to replace advice given to you by your health care provider. Make sure you discuss any questions you have with your health care   provider. Document Released: 06/17/2009 Document Revised: 08/29/2015 Document Reviewed: 05/24/2012 Elsevier Interactive Patient Education  2017 [employer] Inc.  

## 2017-03-17 NOTE — Progress Notes (Signed)
   PRENATAL VISIT NOTE  Subjective:  Monica Harding is a 26 y.o. G3P1011 at 3979w0d being seen today for ongoing prenatal care.  She is currently monitored for the following issues for this high-risk pregnancy and has Maternal obesity, antepartum; H/O keloid of skin; Supervision of high risk pregnancy, antepartum; Chronic hypertension during pregnancy, antepartum; History of cesarean delivery, antepartum; and LGSIL on Pap smear of cervix on their problem list.  Patient reports no complaints.  Contractions: Not present. Vag. Bleeding: None.  Movement: Present. Denies leaking of fluid.   The following portions of the patient's history were reviewed and updated as appropriate: allergies, current medications, past family history, past medical history, past social history, past surgical history and problem list. Problem list updated.  Objective:   Vitals:   03/17/17 1252  BP: 138/75  Pulse: (!) 103  Weight: 254 lb 1.6 oz (115.3 kg)    Fetal Status: Fetal Heart Rate (bpm): 140   Movement: Present     General:  Alert, oriented and cooperative. Patient is in no acute distress.  Skin: Skin is warm and dry. No rash noted.   Cardiovascular: Normal heart rate noted  Respiratory: Normal respiratory effort, no problems with respiration noted  Abdomen: Soft, gravid, appropriate for gestational age.  Pain/Pressure: Absent     Pelvic: Cervical exam deferred        Extremities: Normal range of motion.  Edema: None  Mental Status:  Normal mood and affect. Normal behavior. Normal judgment and thought content.   Assessment and Plan:  Pregnancy: G3P1011 at 1979w0d  1. Chronic hypertension during pregnancy, antepartum Start ASA daily  2. Supervision of high risk pregnancy, antepartum F/u anatomy US in 2 weeks  Preterm labor symptoms and general obstetric precautions including but not limited to vaginal bleeding, contractions, leaking of fluid and fetal movement were reviewed in detail with the  patient. Please refer to After Visit Summary for other counseling recommendations.  Return in about 4 weeks (around 04/14/2017).   Scheryl DarterJames [place], MD

## 2017-03-17 NOTE — Progress Notes (Signed)
Pt c/o occasional heartbeat racing, thinks it may be anxiety related

## 2017-04-01 ENCOUNTER — Encounter (HOSPITAL_COMMUNITY): Payer: Self-pay

## 2017-04-01 ENCOUNTER — Other Ambulatory Visit (HOSPITAL_COMMUNITY): Payer: Self-pay | Admitting: *Deleted

## 2017-04-01 ENCOUNTER — Ambulatory Visit (HOSPITAL_COMMUNITY)
Admission: RE | Admit: 2017-04-01 | Discharge: 2017-04-01 | Disposition: A | Discharge status: Home / Self Care | Payer: Medicaid Other | Source: Ambulatory Visit | Attending: Obstetrics & Gynecology | Admitting: Obstetrics & Gynecology

## 2017-04-01 ENCOUNTER — Other Ambulatory Visit (HOSPITAL_COMMUNITY): Payer: Self-pay | Admitting: Obstetrics and Gynecology

## 2017-04-01 DIAGNOSIS — O99210 Obesity complicating pregnancy, unspecified trimester: Secondary | ICD-10-CM | POA: Insufficient documentation

## 2017-04-01 DIAGNOSIS — O10919 Unspecified pre-existing hypertension complicating pregnancy, unspecified trimester: Secondary | ICD-10-CM

## 2017-04-01 DIAGNOSIS — O34219 Maternal care for unspecified type scar from previous cesarean delivery: Secondary | ICD-10-CM | POA: Diagnosis not present

## 2017-04-01 DIAGNOSIS — Z6837 Body mass index (BMI) 37.0-37.9, adult: Secondary | ICD-10-CM | POA: Diagnosis not present

## 2017-04-01 DIAGNOSIS — Z362 Encounter for other antenatal screening follow-up: Secondary | ICD-10-CM | POA: Insufficient documentation

## 2017-04-01 DIAGNOSIS — Z3A23 23 weeks gestation of pregnancy: Secondary | ICD-10-CM

## 2017-04-01 DIAGNOSIS — E669 Obesity, unspecified: Secondary | ICD-10-CM | POA: Insufficient documentation

## 2017-04-01 DIAGNOSIS — O99212 Obesity complicating pregnancy, second trimester: Secondary | ICD-10-CM

## 2017-04-16 ENCOUNTER — Ambulatory Visit (INDEPENDENT_AMBULATORY_CARE_PROVIDER_SITE_OTHER): Payer: Medicaid Other | Admitting: Obstetrics & Gynecology

## 2017-04-16 ENCOUNTER — Other Ambulatory Visit (HOSPITAL_COMMUNITY)
Admission: RE | Admit: 2017-04-16 | Discharge: 2017-04-16 | Disposition: A | Discharge status: Home / Self Care | Payer: Medicaid Other | Source: Ambulatory Visit | Attending: Obstetrics & Gynecology | Admitting: Obstetrics & Gynecology

## 2017-04-16 VITALS — BP 119/72 | HR 90 | Wt 256.0 lb

## 2017-04-16 DIAGNOSIS — B9689 Other specified bacterial agents as the cause of diseases classified elsewhere: Secondary | ICD-10-CM | POA: Insufficient documentation

## 2017-04-16 DIAGNOSIS — L299 Pruritus, unspecified: Secondary | ICD-10-CM | POA: Diagnosis present

## 2017-04-16 DIAGNOSIS — N760 Acute vaginitis: Secondary | ICD-10-CM | POA: Diagnosis not present

## 2017-04-16 DIAGNOSIS — O0990 Supervision of high risk pregnancy, unspecified, unspecified trimester: Secondary | ICD-10-CM

## 2017-04-16 DIAGNOSIS — B373 Candidiasis of vulva and vagina: Secondary | ICD-10-CM | POA: Insufficient documentation

## 2017-04-16 DIAGNOSIS — O10919 Unspecified pre-existing hypertension complicating pregnancy, unspecified trimester: Secondary | ICD-10-CM

## 2017-04-16 MED ORDER — FLUCONAZOLE 150 MG PO TABS: 150.0000 mg | ORAL_TABLET | Freq: Once | ORAL | 0 refills | Status: AC

## 2017-04-16 NOTE — Patient Instructions (Signed)
Second Trimester of Pregnancy The second trimester is from week 13 through week 28, month 4 through 6. This is often the time in pregnancy that you feel your best. Often times, morning sickness has lessened or quit. You may have more energy, and you may get hungry more often. Your unborn baby (fetus) is growing rapidly. At the end of the sixth month, he or she is about 9 inches long and weighs about 1 pounds. You will likely feel the baby move (quickening) between 18 and 20 weeks of pregnancy. Follow these instructions at home:  Avoid all smoking, herbs, and alcohol. Avoid drugs not approved by your doctor.  Do not use any tobacco products, including cigarettes, chewing tobacco, and electronic cigarettes. If you need help quitting, ask your doctor. You may get counseling or other support to help you quit.  Only take medicine as told by your doctor. Some medicines are safe and some are not during pregnancy.  Exercise only as told by your doctor. Stop exercising if you start having cramps.  Eat regular, healthy meals.  Wear a good support bra if your breasts are tender.  Do not use hot tubs, steam rooms, or saunas.  Wear your seat belt when driving.  Avoid raw meat, uncooked cheese, and liter boxes and soil used by cats.  Take your prenatal vitamins.  Take 1500-2000 milligrams of calcium daily starting at the 20th week of pregnancy until you deliver your baby.  Try taking medicine that helps you poop (stool softener) as needed, and if your doctor approves. Eat more fiber by eating fresh fruit, vegetables, and whole grains. Drink enough fluids to keep your pee (urine) clear or pale yellow.  Take warm water baths (sitz baths) to soothe pain or discomfort caused by hemorrhoids. Use hemorrhoid cream if your doctor approves.  If you have puffy, bulging veins (varicose veins), wear support hose. Raise (elevate) your feet for 15 minutes, 3-4 times a day. Limit salt in your diet.  Avoid heavy  lifting, wear low heals, and sit up straight.  Rest with your legs raised if you have leg cramps or low back pain.  Visit your dentist if you have not gone during your pregnancy. Use a soft toothbrush to brush your teeth. Be gentle when you floss.  You can have sex (intercourse) unless your doctor tells you not to.  Go to your doctor visits. Get help if:  You feel dizzy.  You have mild cramps or pressure in your lower belly (abdomen).  You have a nagging pain in your belly area.  You continue to feel sick to your stomach (nauseous), throw up (vomit), or have watery poop (diarrhea).  You have bad smelling fluid coming from your vagina.  You have pain with peeing (urination). Get help right away if:  You have a fever.  You are leaking fluid from your vagina.  You have spotting or bleeding from your vagina.  You have severe belly cramping or pain.  You lose or gain weight rapidly.  You have trouble catching your breath and have chest pain.  You notice sudden or extreme puffiness (swelling) of your face, hands, ankles, feet, or legs.  You have not felt the baby move in over an hour.  You have severe headaches that do not go away with medicine.  You have vision changes. This information is not intended to replace advice given to you by your health care provider. Make sure you discuss any questions you have with your health care   provider. Document Released: 06/17/2009 Document Revised: 08/29/2015 Document Reviewed: 05/24/2012 Elsevier Interactive Patient Education  2017 [employer] Inc.  

## 2017-04-16 NOTE — Progress Notes (Signed)
Pt stated having white discharge/itching

## 2017-04-16 NOTE — Progress Notes (Signed)
   PRENATAL VISIT NOTE  Subjective:  Monica Harding is a 27 y.o. G3P1011 at 8247w2d being seen today for ongoing prenatal care.  She is currently monitored for the following issues for this high-risk pregnancy and has Maternal obesity, antepartum; H/O keloid of skin; Supervision of high risk pregnancy, antepartum; Chronic hypertension during pregnancy, antepartum; History of cesarean delivery, antepartum; and LGSIL on Pap smear of cervix on their problem list.  Patient reports vaginal irritation.  Contractions: Not present. Vag. Bleeding: None.  Movement: Present. Denies leaking of fluid.   The following portions of the patient's history were reviewed and updated as appropriate: allergies, current medications, past family history, past medical history, past social history, past surgical history and problem list. Problem list updated.  Objective:   Vitals:   04/16/17 0833  BP: 119/72  Pulse: 90  Weight: 256 lb (116.1 kg)    Fetal Status: Fetal Heart Rate (bpm): 148   Movement: Present     General:  Alert, oriented and cooperative. Patient is in no acute distress.  Skin: Skin is warm and dry. No rash noted.   Cardiovascular: Normal heart rate noted  Respiratory: Normal respiratory effort, no problems with respiration noted  Abdomen: Soft, gravid, appropriate for gestational age.  Pain/Pressure: Absent     Pelvic: Cervical exam deferred        Extremities: Normal range of motion.  Edema: None  Mental Status:  Normal mood and affect. Normal behavior. Normal judgment and thought content.   Assessment and Plan:  Pregnancy: G3P1011 at 8047w2d  1. Chronic hypertension during pregnancy, antepartum Nl BP  2. Supervision of high risk pregnancy, antepartum Suspect yeast vaginitis, vaginal probe is pending - fluconazole (DIFLUCAN) 150 MG tablet; Take 1 tablet (150 mg total) by mouth once for 1 dose.  Dispense: 1 tablet; Refill: 0  Preterm labor symptoms and general obstetric precautions  including but not limited to vaginal bleeding, contractions, leaking of fluid and fetal movement were reviewed in detail with the patient. Please refer to After Visit Summary for other counseling recommendations.  Return in about 2 weeks (around 04/30/2017) for 2 hr GTT.   Scheryl DarterJames Dima Mini, MD

## 2017-04-19 LAB — CERVICOVAGINAL ANCILLARY ONLY
Bacterial vaginitis: POSITIVE — AB
Candida vaginitis: POSITIVE — AB
TRICH (WINDOWPATH): NEGATIVE

## 2017-04-22 ENCOUNTER — Other Ambulatory Visit: Payer: Self-pay | Admitting: Obstetrics & Gynecology

## 2017-04-22 DIAGNOSIS — N898 Other specified noninflammatory disorders of vagina: Secondary | ICD-10-CM

## 2017-04-22 MED ORDER — FLUCONAZOLE 150 MG PO TABS: 150.0000 mg | ORAL_TABLET | Freq: Once | ORAL | 0 refills | Status: AC

## 2017-04-22 MED ORDER — METRONIDAZOLE 500 MG PO TABS: 500.0000 mg | ORAL_TABLET | Freq: Two times a day (BID) | ORAL | 0 refills | Status: AC

## 2017-04-28 ENCOUNTER — Other Ambulatory Visit: Payer: Self-pay

## 2017-04-29 ENCOUNTER — Other Ambulatory Visit: Payer: Medicaid Other

## 2017-04-29 DIAGNOSIS — O0990 Supervision of high risk pregnancy, unspecified, unspecified trimester: Secondary | ICD-10-CM

## 2017-04-30 LAB — GLUCOSE TOLERANCE, 2 HOURS W/ 1HR
GLUCOSE, 1 HOUR: 115 mg/dL (ref 65–179)
GLUCOSE, 2 HOUR: 109 mg/dL (ref 65–152)
Glucose, Fasting: 87 mg/dL (ref 65–91)

## 2017-05-03 ENCOUNTER — Ambulatory Visit (INDEPENDENT_AMBULATORY_CARE_PROVIDER_SITE_OTHER): Payer: Medicaid Other | Admitting: Obstetrics & Gynecology

## 2017-05-03 VITALS — BP 115/50 | HR 93 | Wt 260.0 lb

## 2017-05-03 DIAGNOSIS — O0990 Supervision of high risk pregnancy, unspecified, unspecified trimester: Secondary | ICD-10-CM

## 2017-05-03 DIAGNOSIS — O10919 Unspecified pre-existing hypertension complicating pregnancy, unspecified trimester: Secondary | ICD-10-CM

## 2017-05-03 DIAGNOSIS — O0992 Supervision of high risk pregnancy, unspecified, second trimester: Secondary | ICD-10-CM | POA: Diagnosis present

## 2017-05-03 DIAGNOSIS — Z23 Encounter for immunization: Secondary | ICD-10-CM | POA: Diagnosis not present

## 2017-05-03 DIAGNOSIS — O10912 Unspecified pre-existing hypertension complicating pregnancy, second trimester: Secondary | ICD-10-CM | POA: Diagnosis not present

## 2017-05-03 DIAGNOSIS — O34219 Maternal care for unspecified type scar from previous cesarean delivery: Secondary | ICD-10-CM | POA: Diagnosis not present

## 2017-05-03 LAB — POCT URINALYSIS DIP (DEVICE)
Bilirubin Urine: NEGATIVE
Glucose, UA: NEGATIVE mg/dL
Hgb urine dipstick: NEGATIVE
KETONES UR: NEGATIVE mg/dL
Nitrite: NEGATIVE
PH: 6 (ref 5.0–8.0)
PROTEIN: NEGATIVE mg/dL
Specific Gravity, Urine: 1.025 (ref 1.005–1.030)
Urobilinogen, UA: 0.2 mg/dL (ref 0.0–1.0)

## 2017-05-03 NOTE — Progress Notes (Signed)
   PRENATAL VISIT NOTE  Subjective:  Monica Harding is a 27 y.o. G3P1011 at 4546w5d being seen today for ongoing prenatal care.  She is currently monitored for the following issues for this high-risk pregnancy and has Maternal obesity, antepartum; H/O keloid of skin; Supervision of high risk pregnancy, antepartum; Chronic hypertension during pregnancy, antepartum; History of cesarean delivery, antepartum; and LGSIL on Pap smear of cervix on their problem list.  Patient reports no complaints.  Contractions: Not present. Vag. Bleeding: None.  Movement: Present. Denies leaking of fluid.   The following portions of the patient's history were reviewed and updated as appropriate: allergies, current medications, past family history, past medical history, past social history, past surgical history and problem list. Problem list updated.  Objective:   Vitals:   05/03/17 1002  BP: (!) 115/50  Pulse: 93  Weight: 260 lb (117.9 kg)    Fetal Status: Fetal Heart Rate (bpm): 135   Movement: Present     General:  Alert, oriented and cooperative. Patient is in no acute distress.  Skin: Skin is warm and dry. No rash noted.   Cardiovascular: Normal heart rate noted  Respiratory: Normal respiratory effort, no problems with respiration noted  Abdomen: Soft, gravid, appropriate for gestational age.  Pain/Pressure: Absent     Pelvic: Cervical exam deferred        Extremities: Normal range of motion.  Edema: None  Mental Status:  Normal mood and affect. Normal behavior. Normal judgment and thought content.   Assessment and Plan:  Pregnancy: G3P1011 at 8646w5d  1. Need for diphtheria-tetanus-pertussis (Tdap) vaccine  - Tdap vaccine greater than or equal to 7yo IM  2. Supervision of high risk pregnancy, antepartum   3. Chronic hypertension during pregnancy, antepartum Nl BP  4. History of cesarean delivery, antepartum Plans RCS, I gave her TOLAC consent to review  Preterm labor symptoms and  general obstetric precautions including but not limited to vaginal bleeding, contractions, leaking of fluid and fetal movement were reviewed in detail with the patient. Please refer to After Visit Summary for other counseling recommendations.  Return in about 2 weeks (around 05/17/2017).   Scheryl DarterJames Arnold, MD

## 2017-05-05 ENCOUNTER — Encounter (HOSPITAL_COMMUNITY): Payer: Self-pay

## 2017-05-05 ENCOUNTER — Other Ambulatory Visit (HOSPITAL_COMMUNITY): Payer: Self-pay | Admitting: *Deleted

## 2017-05-05 ENCOUNTER — Ambulatory Visit (HOSPITAL_COMMUNITY)
Admission: RE | Admit: 2017-05-05 | Discharge: 2017-05-05 | Disposition: A | Discharge status: Home / Self Care | Payer: Medicaid Other | Source: Ambulatory Visit | Attending: Obstetrics & Gynecology | Admitting: Obstetrics & Gynecology

## 2017-05-05 DIAGNOSIS — O10919 Unspecified pre-existing hypertension complicating pregnancy, unspecified trimester: Secondary | ICD-10-CM | POA: Insufficient documentation

## 2017-05-05 DIAGNOSIS — Z3689 Encounter for other specified antenatal screening: Secondary | ICD-10-CM

## 2017-05-05 DIAGNOSIS — Z3A28 28 weeks gestation of pregnancy: Secondary | ICD-10-CM | POA: Insufficient documentation

## 2017-05-17 ENCOUNTER — Encounter: Payer: Self-pay | Admitting: Obstetrics and Gynecology

## 2017-05-17 ENCOUNTER — Encounter: Payer: Medicaid Other | Admitting: Obstetrics and Gynecology

## 2017-05-17 DIAGNOSIS — E669 Obesity, unspecified: Secondary | ICD-10-CM | POA: Insufficient documentation

## 2017-05-18 NOTE — Progress Notes (Signed)
Patient did not keep OB appointment for 05/17/2017.  Monica Harding, Jr MD Attending [facility](Faculty Practice)

## 2017-05-31 ENCOUNTER — Encounter (HOSPITAL_COMMUNITY): Payer: Self-pay

## 2017-05-31 ENCOUNTER — Ambulatory Visit (INDEPENDENT_AMBULATORY_CARE_PROVIDER_SITE_OTHER): Payer: Medicaid Other | Admitting: Obstetrics & Gynecology

## 2017-05-31 ENCOUNTER — Encounter: Payer: Self-pay | Admitting: Obstetrics & Gynecology

## 2017-05-31 VITALS — BP 114/54 | HR 87

## 2017-05-31 DIAGNOSIS — R12 Heartburn: Secondary | ICD-10-CM

## 2017-05-31 DIAGNOSIS — O26893 Other specified pregnancy related conditions, third trimester: Secondary | ICD-10-CM

## 2017-05-31 DIAGNOSIS — O0990 Supervision of high risk pregnancy, unspecified, unspecified trimester: Secondary | ICD-10-CM

## 2017-05-31 DIAGNOSIS — O26899 Other specified pregnancy related conditions, unspecified trimester: Secondary | ICD-10-CM | POA: Insufficient documentation

## 2017-05-31 MED ORDER — PANTOPRAZOLE SODIUM 20 MG PO TBEC: 20.0000 mg | DELAYED_RELEASE_TABLET | Freq: Every day | ORAL | 1 refills | Status: DC

## 2017-05-31 NOTE — Progress Notes (Signed)
   PRENATAL VISIT NOTE  Subjective:  Monica Harding is a 27 y.o. G3P1011 at 4140w5d being seen today for ongoing prenatal care.  She is currently monitored for the following issues for this low-risk pregnancy and has Maternal obesity, antepartum; H/O keloid of skin; Supervision of high risk pregnancy, antepartum; Chronic hypertension during pregnancy, antepartum; History of cesarean delivery, antepartum; LGSIL on Pap smear of cervix; BMI 30-39; and Heartburn during pregnancy on their problem list.  Patient reports no complaints.  Contractions: Not present. Vag. Bleeding: None.  Movement: Present. Denies leaking of fluid.   The following portions of the patient's history were reviewed and updated as appropriate: allergies, current medications, past family history, past medical history, past social history, past surgical history and problem list. Problem list updated.  Objective:   Vitals:   05/31/17 0831  BP: (!) 114/54  Pulse: 87    Fetal Status: Fetal Heart Rate (bpm): 161   Movement: Present     General:  Alert, oriented and cooperative. Patient is in no acute distress.  Skin: Skin is warm and dry. No rash noted.   Cardiovascular: Normal heart rate noted  Respiratory: Normal respiratory effort, no problems with respiration noted  Abdomen: Soft, gravid, appropriate for gestational age.  Pain/Pressure: Absent     Pelvic: Cervical exam deferred        Extremities: Normal range of motion.  Edema: None  Mental Status:  Normal mood and affect. Normal behavior. Normal judgment and thought content.   Assessment and Plan:  Pregnancy: G3P1011 at 6240w5d  1. Supervision of high risk pregnancy, antepartum  - CBC - HIV antibody - RPR - pantoprazole (PROTONIX) 20 MG tablet; Take 1 tablet (20 mg total) by mouth daily.  Dispense: 30 tablet; Refill: 1  2. Heartburn during pregnancy in third trimester Protoni  Preterm labor symptoms and general obstetric precautions including but not  limited to vaginal bleeding, contractions, leaking of fluid and fetal movement were reviewed in detail with the patient. Please refer to After Visit Summary for other counseling recommendations.  Return in about 2 weeks (around 06/14/2017). US and NST BPP  Scheryl DarterJames Priscillia Fouch, MD

## 2017-05-31 NOTE — Patient Instructions (Signed)
Cesarean Delivery Cesarean birth, or cesarean delivery, is the surgical delivery of a baby through an incision in the abdomen and the uterus. This may be referred to as a C-section. This procedure may be scheduled ahead of time, or it may be done in an emergency situation. Tell a health care provider about:  Any allergies you have.  All medicines you are taking, including vitamins, herbs, eye drops, creams, and over-the-counter medicines.  Any problems you or family members have had with anesthetic medicines.  Any blood disorders you have.  Any surgeries you have had.  Any medical conditions you have.  Whether you or any members of your family have a history of deep vein thrombosis (DVT) or pulmonary embolism (PE). What are the risks? Generally, this is a safe procedure. However, problems may occur, including:  Infection.  Bleeding.  Allergic reactions to medicines.  Damage to other structures or organs.  Blood clots.  Injury to your baby.  What happens before the procedure?  Follow instructions from your health care provider about eating or drinking restrictions.  Follow instructions from your health care provider about bathing before your procedure to help reduce your risk of infection.  If you know that you are going to have a cesarean delivery, do not shave your pubic area. Shaving before the procedure may increase your risk of infection.  Ask your health care provider about: ? Changing or stopping your regular medicines. This is especially important if you are taking diabetes medicines or blood thinners. ? Your pain management plan. This is especially important if you plan to breastfeed your baby. ? How long you will be in the hospital after the procedure. ? Any concerns you may have about receiving blood products if you need them during the procedure. ? Cord blood banking, if you plan to collect your baby's umbilical cord blood.  You may also want to ask your  health care provider: ? Whether you will be able to hold or breastfeed your baby while you are still in the operating room. ? Whether your baby can stay with you immediately after the procedure and during your recovery. ? Whether a family member or a person of your choice can go with you into the operating room and stay with you during the procedure, immediately after the procedure, and during your recovery.  Plan to have someone drive you home when you are discharged from the hospital. What happens during the procedure?  Fetal monitors will be placed on your abdomen to monitor your heart rate and your baby's heart rate.  Depending on the reason for your cesarean delivery, you may have a physical exam or additional testing, such as an ultrasound.  An IV tube will be inserted into one of your veins.  You may have your blood or urine tested.  You will be given antibiotic medicine to help prevent infection.  You may be given a special warming gown to wear to keep your temperature stable.  Hair may be removed from your pubic area.  The skin of your pubic area and lower abdomen will be cleaned with a germ-killing solution (antiseptic).  A catheter may be inserted into your bladder through your urethra. This drains your urine during the procedure.  You may be given one or more of the following: ? A medicine to numb the area (local anesthetic). ? A medicine to make you fall asleep (general anesthetic). ? A medicine (regional anesthetic) that is injected into your back or through a small   thin tube placed in your back (spinal anesthetic or epidural anesthetic). This numbs everything below the injection site and allows you to stay awake during your procedure. If this makes you feel nauseous, tell your health care provider. Medicines will be available to help reduce any nausea you may feel.  An incision will be made in your abdomen, and then in your uterus.  If you are awake during your  procedure, you may feel tugging and pulling in your abdomen, but you should not feel pain. If you feel pain, tell your health care provider immediately.  Your baby will be removed from your uterus. You may feel more pressure or pushing while this happens.  Immediately after birth, your baby will be dried and kept warm. You may be able to hold and breastfeed your baby. The umbilical cord may be clamped and cut during this time.  Your placenta will be removed from your uterus.  Your incisions will be closed with stitches (sutures). Staples, skin glue, or adhesive strips may also be applied to the incision in your abdomen.  Bandages (dressings) will be placed over the incision in your abdomen. The procedure may vary among health care providers and hospitals. What happens after the procedure?  Your blood pressure, heart rate, breathing rate, and blood oxygen level will be monitored often until the medicines you were given have worn off.  You may continue to receive fluids and medicines through an IV tube.  You will have some pain. Medicines will be available to help control your pain.  To help prevent blood clots: ? You may be given medicines. ? You may have to wear compression stockings or devices. ? You will be encouraged to walk around when you are able.  Hospital staff will encourage and support bonding with your baby. Your hospital may allow you and your baby to stay in the same room (rooming in) during your hospital stay to encourage successful breastfeeding.  You may be encouraged to cough and breathe deeply often. This helps to prevent lung problems.  If you have a catheter draining your urine, it will be removed as soon as possible after your procedure. This information is not intended to replace advice given to you by your health care provider. Make sure you discuss any questions you have with your health care provider. Document Released: 03/23/2005 Document Revised: 08/29/2015  Document Reviewed: 01/01/2015 Elsevier Interactive Patient Education  2018 [employer]  

## 2017-06-01 LAB — CBC
Hematocrit: 32.2 % — ABNORMAL LOW (ref 34.0–46.6)
Hemoglobin: 10.5 g/dL — ABNORMAL LOW (ref 11.1–15.9)
MCH: 30.2 pg (ref 26.6–33.0)
MCHC: 32.6 g/dL (ref 31.5–35.7)
MCV: 93 fL (ref 79–97)
Platelets: 205 10*3/uL (ref 150–379)
RBC: 3.48 x10E6/uL — AB (ref 3.77–5.28)
RDW: 14.2 % (ref 12.3–15.4)
WBC: 4.7 10*3/uL (ref 3.4–10.8)

## 2017-06-01 LAB — HIV ANTIBODY (ROUTINE TESTING W REFLEX): HIV Screen 4th Generation wRfx: NONREACTIVE

## 2017-06-01 LAB — RPR: RPR Ser Ql: NONREACTIVE

## 2017-06-02 ENCOUNTER — Encounter (HOSPITAL_COMMUNITY): Payer: Self-pay

## 2017-06-02 ENCOUNTER — Ambulatory Visit (HOSPITAL_COMMUNITY)
Admission: RE | Admit: 2017-06-02 | Discharge: 2017-06-02 | Disposition: A | Discharge status: Home / Self Care | Payer: Medicaid Other | Source: Ambulatory Visit | Attending: Obstetrics & Gynecology | Admitting: Obstetrics & Gynecology

## 2017-06-07 ENCOUNTER — Other Ambulatory Visit: Payer: Medicaid Other

## 2017-06-07 ENCOUNTER — Ambulatory Visit (INDEPENDENT_AMBULATORY_CARE_PROVIDER_SITE_OTHER): Payer: Medicaid Other | Admitting: *Deleted

## 2017-06-07 ENCOUNTER — Other Ambulatory Visit: Payer: Self-pay

## 2017-06-07 ENCOUNTER — Encounter (HOSPITAL_COMMUNITY): Payer: Self-pay

## 2017-06-07 ENCOUNTER — Ambulatory Visit (HOSPITAL_COMMUNITY)
Admission: RE | Admit: 2017-06-07 | Discharge: 2017-06-07 | Disposition: A | Discharge status: Home / Self Care | Payer: Medicaid Other | Source: Ambulatory Visit | Attending: Obstetrics & Gynecology | Admitting: Obstetrics & Gynecology

## 2017-06-07 ENCOUNTER — Inpatient Hospital Stay (HOSPITAL_COMMUNITY)
Admission: AD | Admit: 2017-06-07 | Discharge: 2017-06-07 | Disposition: A | Discharge status: Home / Self Care | Payer: Medicaid Other | Source: Ambulatory Visit | Attending: Obstetrics and Gynecology | Admitting: Obstetrics and Gynecology

## 2017-06-07 VITALS — BP 121/64 | HR 106

## 2017-06-07 DIAGNOSIS — Z3A32 32 weeks gestation of pregnancy: Secondary | ICD-10-CM | POA: Insufficient documentation

## 2017-06-07 DIAGNOSIS — O10913 Unspecified pre-existing hypertension complicating pregnancy, third trimester: Secondary | ICD-10-CM

## 2017-06-07 DIAGNOSIS — Z3689 Encounter for other specified antenatal screening: Secondary | ICD-10-CM

## 2017-06-07 DIAGNOSIS — Z79899 Other long term (current) drug therapy: Secondary | ICD-10-CM | POA: Diagnosis not present

## 2017-06-07 DIAGNOSIS — O10919 Unspecified pre-existing hypertension complicating pregnancy, unspecified trimester: Secondary | ICD-10-CM | POA: Diagnosis not present

## 2017-06-07 DIAGNOSIS — O34219 Maternal care for unspecified type scar from previous cesarean delivery: Secondary | ICD-10-CM | POA: Insufficient documentation

## 2017-06-07 DIAGNOSIS — Z7982 Long term (current) use of aspirin: Secondary | ICD-10-CM | POA: Diagnosis not present

## 2017-06-07 DIAGNOSIS — O289 Unspecified abnormal findings on antenatal screening of mother: Secondary | ICD-10-CM | POA: Diagnosis not present

## 2017-06-07 DIAGNOSIS — O133 Gestational [pregnancy-induced] hypertension without significant proteinuria, third trimester: Secondary | ICD-10-CM | POA: Insufficient documentation

## 2017-06-07 DIAGNOSIS — E669 Obesity, unspecified: Secondary | ICD-10-CM | POA: Diagnosis not present

## 2017-06-07 DIAGNOSIS — O99213 Obesity complicating pregnancy, third trimester: Secondary | ICD-10-CM | POA: Insufficient documentation

## 2017-06-07 LAB — FETAL NONSTRESS TEST

## 2017-06-07 NOTE — MAU Provider Note (Signed)
History     CSN: [id number]  Arrival date and time: 06/07/17 1644  G3P1011 @32 .5 wks sent from MFM for BPP 6/8 off for movement. Pt reports +FM, more at night. No VB, LOF, or ctx. Her pregnancy is complicated by [facility], not on meds, and previous CS.     OB History    Gravida Para Term Preterm AB Living   3 1 1   1 1    SAB TAB Ectopic Multiple Live Births           1      Past Medical History:  Diagnosis Date  . Chlamydia   . Hypertension   . Vaginal Pap smear, abnormal     Past Surgical History:  Procedure Laterality Date  . CESAREAN SECTION N/A 12/09/2013   Procedure: CESAREAN SECTION;  Surgeon: Lesly Dukes, MD;  Location: [facility];  Service: Obstetrics;  Laterality: N/A;    History reviewed. No pertinent family history.  Social History   Tobacco Use  . Smoking status: Never Smoker  . Smokeless tobacco: Never Used  Substance Use Topics  . Alcohol use: No    Comment: social-- before pregnant   . Drug use: No    Allergies: No Known Allergies  Medications Prior to Admission  Medication Sig Dispense Refill Last Dose  . acetaminophen (TYLENOL) 500 MG tablet Take 1,000-1,500 mg by mouth every 6 (six) hours as needed for headache.    Taking  . aspirin 81 MG chewable tablet Chew 1 tablet (81 mg total) daily by mouth. 30 tablet 4 Taking  . diphenhydrAMINE (BENADRYL) 25 mg capsule Take 1 capsule (25 mg total) by mouth every 6 (six) hours as needed. (Patient not taking: Reported on 05/03/2017) 30 capsule 0 Not Taking  . pantoprazole (PROTONIX) 20 MG tablet Take 1 tablet (20 mg total) by mouth daily. 30 tablet 1   . Prenatal Multivit-Min-Fe-FA (PRENATAL VITAMINS) 0.8 MG tablet Take 1 tablet by mouth daily. 30 tablet 12 Taking    Review of Systems  Gastrointestinal: Negative for abdominal pain.  Genitourinary: Negative for vaginal bleeding and vaginal discharge.   Physical Exam   Blood pressure 125/74, pulse (!) 104, temperature 98 F (36.7 C), temperature source Oral,  resp. rate 18, height 5\' 7"  (1.702 m), weight 259 lb (117.5 kg), last menstrual period 10/21/2016, unknown if currently breastfeeding.  Physical Exam  Nursing note and vitals reviewed. Constitutional: She is oriented to person, place, and time. She appears well-developed and well-nourished. No distress.  HENT:  Head: Normocephalic and atraumatic.  Neck: Normal range of motion.  Respiratory: Effort normal. No respiratory distress.  Musculoskeletal: Normal range of motion.  Neurological: She is alert and oriented to person, place, and time.  Skin: Skin is warm and dry.  Psychiatric: She has a normal mood and affect.  EFM: 135 bpm, mod variability, + accels, no decels Toco: none  MAU Course  Procedures  MDM BPP 6/8 off for movement, AFI 11.24. NST reactive >BPP 8/10. Stable for discharge.  Assessment and Plan   1. NST (non-stress test) reactive   2. [redacted] weeks gestation of pregnancy   3.      Chronic HTN affecting pregnancy  Discharge home Follow up in OB office as scheduled FMCs  Allergies as of 06/07/2017   No Known Allergies     Medication List    STOP taking these medications   diphenhydrAMINE 25 mg capsule Commonly known as:  BENADRYL     TAKE these medications  acetaminophen 500 MG tablet Commonly known as:  TYLENOL Take 1,000-1,500 mg by mouth every 6 (six) hours as needed for headache.   aspirin 81 MG chewable tablet Chew 1 tablet (81 mg total) daily by mouth.   pantoprazole 20 MG tablet Commonly known as:  PROTONIX Take 1 tablet (20 mg total) by mouth daily.   Prenatal Vitamins 0.8 MG tablet Take 1 tablet by mouth daily.      Donette LarryMelanie Jaislyn Blinn, CNM 06/07/2017, 5:39 PM

## 2017-06-07 NOTE — MAU Note (Signed)
Pt presents to MAU from MFM for a NST due to BPP results. Pt denies VB and LOF. +FM

## 2017-06-07 NOTE — Progress Notes (Signed)
NST reviewed by Dr. Adrian BlackwaterStinson.  Pt has scheduled US for growth and BPP today following NST.

## 2017-06-07 NOTE — Discharge Instructions (Signed)

## 2017-06-08 DIAGNOSIS — O288 Other abnormal findings on antenatal screening of mother: Secondary | ICD-10-CM | POA: Insufficient documentation

## 2017-06-08 DIAGNOSIS — Z3689 Encounter for other specified antenatal screening: Secondary | ICD-10-CM | POA: Insufficient documentation

## 2017-06-10 ENCOUNTER — Encounter: Payer: Self-pay | Admitting: *Deleted

## 2017-06-14 ENCOUNTER — Encounter: Payer: Medicaid Other | Admitting: Obstetrics & Gynecology

## 2017-06-14 ENCOUNTER — Ambulatory Visit: Payer: Self-pay

## 2017-06-14 ENCOUNTER — Ambulatory Visit (INDEPENDENT_AMBULATORY_CARE_PROVIDER_SITE_OTHER): Payer: Medicaid Other | Admitting: General Practice

## 2017-06-14 ENCOUNTER — Ambulatory Visit (INDEPENDENT_AMBULATORY_CARE_PROVIDER_SITE_OTHER): Payer: Medicaid Other | Admitting: Obstetrics & Gynecology

## 2017-06-14 VITALS — BP 122/72 | HR 113 | Wt 261.0 lb

## 2017-06-14 DIAGNOSIS — O0990 Supervision of high risk pregnancy, unspecified, unspecified trimester: Secondary | ICD-10-CM

## 2017-06-14 DIAGNOSIS — O10919 Unspecified pre-existing hypertension complicating pregnancy, unspecified trimester: Secondary | ICD-10-CM

## 2017-06-14 DIAGNOSIS — O10913 Unspecified pre-existing hypertension complicating pregnancy, third trimester: Secondary | ICD-10-CM

## 2017-06-14 DIAGNOSIS — O0993 Supervision of high risk pregnancy, unspecified, third trimester: Secondary | ICD-10-CM | POA: Diagnosis not present

## 2017-06-14 LAB — POCT URINALYSIS DIP (DEVICE)
Bilirubin Urine: NEGATIVE
Glucose, UA: NEGATIVE mg/dL
Hgb urine dipstick: NEGATIVE
LEUKOCYTES UA: NEGATIVE
NITRITE: NEGATIVE
PH: 6 (ref 5.0–8.0)
PROTEIN: NEGATIVE mg/dL
Specific Gravity, Urine: 1.02 (ref 1.005–1.030)
UROBILINOGEN UA: 1 mg/dL (ref 0.0–1.0)

## 2017-06-14 NOTE — Progress Notes (Signed)
Pt informed that the ultrasound is considered a limited OB ultrasound and is not intended to be a complete ultrasound exam.  Patient also informed that the ultrasound is not being completed with the intent of assessing for fetal or placental anomalies or any pelvic abnormalities.  Explained that the purpose of today's ultrasound is to assess for  BPP, presentation and AFI.  Patient acknowledges the purpose of the exam and the limitations of the study.    

## 2017-06-14 NOTE — Progress Notes (Signed)
   PRENATAL VISIT NOTE  Subjective:  Monica Harding is a 27 y.o. G3P1011 at 5541w5d being seen today for ongoing prenatal care.  She is currently monitored for the following issues for this high-risk pregnancy and has Maternal obesity, antepartum; H/O keloid of skin; Supervision of high risk pregnancy, antepartum; Chronic hypertension during pregnancy, antepartum; History of cesarean delivery, antepartum; LGSIL on Pap smear of cervix; BMI 30-39; Heartburn during pregnancy; Encounter for ultrasound to assess fetal growth; and Non-reactive NST (non-stress test) on their problem list.  Patient reports no complaints.  Contractions: Not present. Vag. Bleeding: None.  Movement: Present. Denies leaking of fluid.   The following portions of the patient's history were reviewed and updated as appropriate: allergies, current medications, past family history, past medical history, past social history, past surgical history and problem list. Problem list updated.  Objective:   Vitals:   06/14/17 1023  BP: 122/72  Pulse: (!) 113  Weight: 261 lb (118.4 kg)    Fetal Status: Fetal Heart Rate (bpm): NST   Movement: Present     General:  Alert, oriented and cooperative. Patient is in no acute distress.  Skin: Skin is warm and dry. No rash noted.   Cardiovascular: Normal heart rate noted  Respiratory: Normal respiratory effort, no problems with respiration noted  Abdomen: Soft, gravid, appropriate for gestational age.  Pain/Pressure: Absent     Pelvic: Cervical exam deferred        Extremities: Normal range of motion.  Edema: None  Mental Status:  Normal mood and affect. Normal behavior. Normal judgment and thought content.   Assessment and Plan:  Pregnancy: G3P1011 at 5141w5d  1. Chronic hypertension during pregnancy, antepartum Normal BP  2. Supervision of high risk pregnancy, antepartum BPP 8/8  Preterm labor symptoms and general obstetric precautions including but not limited to vaginal  bleeding, contractions, leaking of fluid and fetal movement were reviewed in detail with the patient. Please refer to After Visit Summary for other counseling recommendations.  Return in about 1 week (around 06/21/2017).   Scheryl DarterJames Arnold, MD

## 2017-06-14 NOTE — Patient Instructions (Signed)

## 2017-06-21 ENCOUNTER — Ambulatory Visit: Payer: Self-pay

## 2017-06-21 ENCOUNTER — Ambulatory Visit (INDEPENDENT_AMBULATORY_CARE_PROVIDER_SITE_OTHER): Payer: Medicaid Other | Admitting: Obstetrics and Gynecology

## 2017-06-21 ENCOUNTER — Ambulatory Visit (INDEPENDENT_AMBULATORY_CARE_PROVIDER_SITE_OTHER): Payer: Medicaid Other | Admitting: *Deleted

## 2017-06-21 ENCOUNTER — Encounter: Payer: Self-pay | Admitting: Obstetrics and Gynecology

## 2017-06-21 VITALS — BP 119/67 | HR 123 | Wt 259.5 lb

## 2017-06-21 DIAGNOSIS — O0990 Supervision of high risk pregnancy, unspecified, unspecified trimester: Secondary | ICD-10-CM

## 2017-06-21 DIAGNOSIS — O10919 Unspecified pre-existing hypertension complicating pregnancy, unspecified trimester: Secondary | ICD-10-CM

## 2017-06-21 DIAGNOSIS — Z3009 Encounter for other general counseling and advice on contraception: Secondary | ICD-10-CM

## 2017-06-21 DIAGNOSIS — O10913 Unspecified pre-existing hypertension complicating pregnancy, third trimester: Secondary | ICD-10-CM

## 2017-06-21 DIAGNOSIS — O0993 Supervision of high risk pregnancy, unspecified, third trimester: Secondary | ICD-10-CM

## 2017-06-21 DIAGNOSIS — R87612 Low grade squamous intraepithelial lesion on cytologic smear of cervix (LGSIL): Secondary | ICD-10-CM

## 2017-06-21 DIAGNOSIS — O34219 Maternal care for unspecified type scar from previous cesarean delivery: Secondary | ICD-10-CM

## 2017-06-21 NOTE — Progress Notes (Signed)
   PRENATAL VISIT NOTE  Subjective:  Monica Harding is a 27 y.o. G3P1011 at 7579w5d being seen today for ongoing prenatal care.  She is currently monitored for the following issues for this high-risk pregnancy and has Maternal obesity, antepartum; H/O keloid of skin; Supervision of high risk pregnancy, antepartum; Chronic hypertension during pregnancy, antepartum; History of cesarean delivery, antepartum; LGSIL on Pap smear of cervix; BMI 30-39; Heartburn during pregnancy; Encounter for ultrasound to assess fetal growth; and Non-reactive NST (non-stress test) on their problem list.  Patient reports no complaints.  Contractions: Not present. Vag. Bleeding: None.  Movement: Present. Denies leaking of fluid.   The following portions of the patient's history were reviewed and updated as appropriate: allergies, current medications, past family history, past medical history, past social history, past surgical history and problem list. Problem list updated.  Objective:   Vitals:   06/21/17 1037  BP: 119/67  Pulse: (!) 123  Weight: 259 lb 8 oz (117.7 kg)    Fetal Status: Fetal Heart Rate (bpm): NST   Movement: Present     General:  Alert, oriented and cooperative. Patient is in no acute distress.  Skin: Skin is warm and dry. No rash noted.   Cardiovascular: Normal heart rate noted  Respiratory: Normal respiratory effort, no problems with respiration noted  Abdomen: Soft, gravid, appropriate for gestational age.  Pain/Pressure: Present     Pelvic: Cervical exam deferred        Extremities: Normal range of motion.  Edema: None  Mental Status:  Normal mood and affect. Normal behavior. Normal judgment and thought content.   Assessment and Plan:  Pregnancy: G3P1011 at 5679w5d  1. Supervision of high risk pregnancy, antepartum No issues  2. Chronic hypertension during pregnancy, antepartum No meds Cont baby ASA Last growth 58th%tile BPP 8/10 today (non-reactive NST)  3. History of  cesarean delivery, antepartum RCS scheduled for 4/19  4. LGSIL on Pap smear of cervix colpo PP  5. Unwanted fertility Reviewed tubal ligation with patient. She understands that this is a permanent procedure and she will not be able to have children after it is done. Reviewed risk of regret. Reviewed that bilateral tubal ligation is not 100% effective and she should take a pregnancy test if she believes for any reason she may be pregnant. Reviewed slightly increased risk of ectopic pregnancy and need to seek care if she becomes pregnant. She understands this is an elective procedure and desires to sign paperwork.   Preterm labor symptoms and general obstetric precautions including but not limited to vaginal bleeding, contractions, leaking of fluid and fetal movement were reviewed in detail with the patient. Please refer to After Visit Summary for other counseling recommendations.  Return in about 1 week (around 06/28/2017) for OB visit (MD), NST.   Conan BowensKelly M Davis, MD

## 2017-06-21 NOTE — Progress Notes (Signed)
I have reviewed this chart and agree with the RN/CMA assessment and management.    K. Meryl Lenah Messenger, M.D. Attending Obstetrician & Gynecologist, [facility], [facility]  

## 2017-06-21 NOTE — Progress Notes (Signed)

## 2017-06-22 ENCOUNTER — Encounter: Payer: Self-pay | Admitting: *Deleted

## 2017-06-23 ENCOUNTER — Encounter: Payer: Medicaid Other | Admitting: Obstetrics & Gynecology

## 2017-06-28 ENCOUNTER — Encounter: Payer: Self-pay | Admitting: *Deleted

## 2017-06-30 ENCOUNTER — Other Ambulatory Visit (HOSPITAL_COMMUNITY)
Admission: RE | Admit: 2017-06-30 | Discharge: 2017-06-30 | Disposition: A | Discharge status: Home / Self Care | Payer: Medicaid Other | Source: Ambulatory Visit | Attending: Obstetrics & Gynecology | Admitting: Obstetrics & Gynecology

## 2017-06-30 ENCOUNTER — Ambulatory Visit (HOSPITAL_COMMUNITY)
Admission: RE | Admit: 2017-06-30 | Discharge: 2017-06-30 | Disposition: A | Discharge status: Home / Self Care | Payer: Medicaid Other | Source: Ambulatory Visit | Attending: Obstetrics & Gynecology | Admitting: Obstetrics & Gynecology

## 2017-06-30 ENCOUNTER — Other Ambulatory Visit (HOSPITAL_COMMUNITY): Payer: Self-pay | Admitting: *Deleted

## 2017-06-30 ENCOUNTER — Ambulatory Visit (INDEPENDENT_AMBULATORY_CARE_PROVIDER_SITE_OTHER): Payer: Medicaid Other | Admitting: General Practice

## 2017-06-30 ENCOUNTER — Ambulatory Visit (INDEPENDENT_AMBULATORY_CARE_PROVIDER_SITE_OTHER): Payer: Medicaid Other | Admitting: Obstetrics & Gynecology

## 2017-06-30 ENCOUNTER — Other Ambulatory Visit (HOSPITAL_COMMUNITY): Payer: Self-pay | Admitting: Maternal & Fetal Medicine

## 2017-06-30 ENCOUNTER — Encounter: Payer: Self-pay | Admitting: Obstetrics & Gynecology

## 2017-06-30 VITALS — BP 123/70 | HR 108 | Wt 257.0 lb

## 2017-06-30 DIAGNOSIS — Z3A36 36 weeks gestation of pregnancy: Secondary | ICD-10-CM | POA: Diagnosis not present

## 2017-06-30 DIAGNOSIS — O10919 Unspecified pre-existing hypertension complicating pregnancy, unspecified trimester: Secondary | ICD-10-CM

## 2017-06-30 DIAGNOSIS — O0990 Supervision of high risk pregnancy, unspecified, unspecified trimester: Secondary | ICD-10-CM

## 2017-06-30 DIAGNOSIS — O10913 Unspecified pre-existing hypertension complicating pregnancy, third trimester: Secondary | ICD-10-CM | POA: Insufficient documentation

## 2017-06-30 DIAGNOSIS — Z3689 Encounter for other specified antenatal screening: Secondary | ICD-10-CM

## 2017-06-30 LAB — POCT URINALYSIS DIP (DEVICE)
GLUCOSE, UA: NEGATIVE mg/dL
Hgb urine dipstick: NEGATIVE
Leukocytes, UA: NEGATIVE
Nitrite: NEGATIVE
Protein, ur: 30 mg/dL — AB
Specific Gravity, Urine: >=1.03 (ref 1.005–1.030)
Urobilinogen, UA: 4 mg/dL — ABNORMAL HIGH (ref 0.0–1.0)
pH: 6 (ref 5.0–8.0)

## 2017-06-30 NOTE — Progress Notes (Signed)
Patient reports decreased FM today, but baby has otherwise been moving fine. NST reactive.  MFM US on chart.    PRENATAL VISIT NOTE  Subjective:  Monica Harding is a 27 y.o. G3P1011 at 647w0d being seen today for ongoing prenatal care.  She is currently monitored for the following issues for this high-risk pregnancy and has Maternal obesity, antepartum; H/O keloid of skin; Supervision of high risk pregnancy, antepartum; Chronic hypertension during pregnancy, antepartum; History of cesarean delivery, antepartum; LGSIL on Pap smear of cervix; BMI 30-39; Heartburn during pregnancy; Encounter for ultrasound to assess fetal growth; Non-reactive NST (non-stress test); and Unwanted fertility on their problem list.  Patient reports no complaints.  Contractions: Not present. Vag. Bleeding: None.  Movement: (!) Decreased. Denies leaking of fluid.   The following portions of the patient's history were reviewed and updated as appropriate: allergies, current medications, past family history, past medical history, past social history, past surgical history and problem list. Problem list updated.  Objective:   Vitals:   06/30/17 1405  BP: 123/70  Pulse: (!) 146  Weight: 257 lb (116.6 kg)  Manual pulse is 108 after visit.    Fetal Status: Fetal Heart Rate (bpm): NST   Movement: (!) Decreased     General:  Alert, oriented and cooperative. Patient is in no acute distress.  Skin: Skin is warm and dry. No rash noted.   Cardiovascular: Normal heart rate noted  Respiratory: Normal respiratory effort, no problems with respiration noted  Abdomen: Soft, gravid, appropriate for gestational age.  Pain/Pressure: Present     Pelvic: Cervical exam deferred        Extremities: Normal range of motion.  Edema: None  Mental Status:  Normal mood and affect. Normal behavior. Normal judgment and thought content.   Assessment and Plan:  Pregnancy: G3P1011 at 747w0d  1. Chronic hypertension during pregnancy,  antepartum BP nml; NST reactive today. Continue weekly BPP with delivery at 39 weeks.   2. Supervision of high risk pregnancy, antepartum - Culture, beta strep (group b only) - GC/Chlamydia probe amp ([place]atan)  Term labor symptoms and general obstetric precautions including but not limited to vaginal bleeding, contractions, leaking of fluid and fetal movement were reviewed in detail with the patient. Please refer to After Visit Summary for other counseling recommendations.  Return in 1 week (on 07/07/2017).   Elsie LincolnKelly Leggett, MD

## 2017-06-30 NOTE — Addendum Note (Signed)
Encounter addended by: Lenise ArenaBazemore, Anecia Nusbaum, RDMS on: 06/30/2017 1:34 PM  Actions taken: Imaging Exam ended

## 2017-06-30 NOTE — Addendum Note (Signed)
Encounter addended by: Lenise ArenaBazemore, Madox Corkins, RDMS on: 06/30/2017 1:33 PM  Actions taken: Imaging Exam begun

## 2017-07-01 LAB — GC/CHLAMYDIA PROBE AMP (~~LOC~~) NOT AT ARMC
CHLAMYDIA, DNA PROBE: NEGATIVE
Neisseria Gonorrhea: NEGATIVE

## 2017-07-02 ENCOUNTER — Other Ambulatory Visit: Payer: Self-pay | Admitting: Obstetrics & Gynecology

## 2017-07-02 DIAGNOSIS — O0990 Supervision of high risk pregnancy, unspecified, unspecified trimester: Secondary | ICD-10-CM

## 2017-07-04 LAB — CULTURE, BETA STREP (GROUP B ONLY): STREP GP B CULTURE: NEGATIVE

## 2017-07-07 ENCOUNTER — Ambulatory Visit (INDEPENDENT_AMBULATORY_CARE_PROVIDER_SITE_OTHER): Payer: Medicaid Other | Admitting: *Deleted

## 2017-07-07 ENCOUNTER — Ambulatory Visit (INDEPENDENT_AMBULATORY_CARE_PROVIDER_SITE_OTHER): Payer: Medicaid Other | Admitting: Nurse Practitioner

## 2017-07-07 ENCOUNTER — Ambulatory Visit: Payer: Self-pay

## 2017-07-07 VITALS — BP 100/83 | HR 108 | Wt 259.7 lb

## 2017-07-07 DIAGNOSIS — O10919 Unspecified pre-existing hypertension complicating pregnancy, unspecified trimester: Secondary | ICD-10-CM

## 2017-07-07 DIAGNOSIS — O0990 Supervision of high risk pregnancy, unspecified, unspecified trimester: Secondary | ICD-10-CM

## 2017-07-07 DIAGNOSIS — O0993 Supervision of high risk pregnancy, unspecified, third trimester: Secondary | ICD-10-CM

## 2017-07-07 DIAGNOSIS — O10913 Unspecified pre-existing hypertension complicating pregnancy, third trimester: Secondary | ICD-10-CM

## 2017-07-07 DIAGNOSIS — O34219 Maternal care for unspecified type scar from previous cesarean delivery: Secondary | ICD-10-CM

## 2017-07-07 NOTE — Progress Notes (Signed)
    Subjective:  Monica Harding is a 27 y.o. G3P1011 at 4322w0d being seen today for ongoing prenatal care.  She is currently monitored for the following issues for this high-risk pregnancy and has Maternal obesity, antepartum; H/O keloid of skin; Supervision of high risk pregnancy, antepartum; Chronic hypertension during pregnancy, antepartum; History of cesarean delivery, antepartum; LGSIL on Pap smear of cervix; BMI 30-39; Heartburn during pregnancy; and Unwanted fertility on their problem list.  Patient reports headache. Was given tylenol while getting NST.  Headache is much better.  Vitals normal.  Contractions: Not present. Vag. Bleeding: None.  Movement: Present. Denies leaking of fluid.   The following portions of the patient's history were reviewed and updated as appropriate: allergies, current medications, past family history, past medical history, past social history, past surgical history and problem list. Problem list updated.  Objective:   Vitals:   07/07/17 1036  BP: 100/83  Pulse: (!) 108  Weight: 259 lb 11.2 oz (117.8 kg)    Fetal Status: Fetal Heart Rate (bpm): NST   Movement: Present     General:  Alert, oriented and cooperative. Patient is in no acute distress.  Skin: Skin is warm and dry. No rash noted.   Cardiovascular: Normal heart rate noted  Respiratory: Normal respiratory effort, no problems with respiration noted  Abdomen: Soft, gravid, appropriate for gestational age. Pain/Pressure: Present     Pelvic:  Cervical exam deferred        Extremities: Normal range of motion.  Edema: None  Mental Status: Normal mood and affect. Normal behavior. Normal judgment and thought content.   Urinalysis:      Assessment and Plan:  Pregnancy: G3P1011 at 2022w0d  1. Supervision of high risk pregnancy, antepartum NST today.  Make sure to drink 64 ounces of fluid daily and eat every 3-4 hours to avoid headaches. NST reviewed today and is reactive - FHT baseline 125 with  moderate variability and 15x15 accels noted - no contractions, no decelerations.   2. Chronic hypertension during pregnancy, antepartum Continuing on Aspirin  3. History of cesarean delivery, antepartum   Term labor symptoms and general obstetric precautions including but not limited to vaginal bleeding, contractions, leaking of fluid and fetal movement were reviewed in detail with the patient. Please refer to After Visit Summary for other counseling recommendations.  Return in about 1 week (around 07/14/2017) for NST/BPP and HOB; Also [facility] @ 1415[facility]  Nolene BernheimERRI Dynastie Knoop, RN, MSN, NP-BC Nurse Practitioner, [facility], [facility] 07/07/2017 11:56 AM

## 2017-07-07 NOTE — Progress Notes (Signed)
Pt reports having a bad headache (pain scale 7) and upset stomach today. Tylenol 1000 mg po given.

## 2017-07-07 NOTE — Progress Notes (Signed)

## 2017-07-07 NOTE — Patient Instructions (Signed)
Braxton Hicks Contractions °Contractions of the uterus can occur throughout pregnancy, but they are not always a sign that you are in labor. You may have practice contractions called Braxton Hicks contractions. These false labor contractions are sometimes confused with true labor. °What are Braxton Hicks contractions? °Braxton Hicks contractions are tightening movements that occur in the muscles of the uterus before labor. Unlike true labor contractions, these contractions do not result in opening (dilation) and thinning of the cervix. Toward the end of pregnancy (32-34 weeks), Braxton Hicks contractions can happen more often and may become stronger. These contractions are sometimes difficult to tell apart from true labor because they can be very uncomfortable. You should not feel embarrassed if you go to the hospital with false labor. °Sometimes, the only way to tell if you are in true labor is for your health care provider to look for changes in the cervix. The health care provider will do a physical exam and may monitor your contractions. If you are not in true labor, the exam should show that your cervix is not dilating and your water has not broken. °If there are other health problems associated with your pregnancy, it is completely safe for you to be sent home with false labor. You may continue to have Braxton Hicks contractions until you go into true labor. °How to tell the difference between true labor and false labor °True labor °· Contractions last 30-70 seconds. °· Contractions become very regular. °· Discomfort is usually felt in the top of the uterus, and it spreads to the lower abdomen and low back. °· Contractions do not go away with walking. °· Contractions usually become more intense and increase in frequency. °· The cervix dilates and gets thinner. °False labor °· Contractions are usually shorter and not as strong as true labor contractions. °· Contractions are usually irregular. °· Contractions  are often felt in the front of the lower abdomen and in the groin. °· Contractions may go away when you walk around or change positions while lying down. °· Contractions get weaker and are shorter-lasting as time goes on. °· The cervix usually does not dilate or become thin. °Follow these instructions at home: °· Take over-the-counter and prescription medicines only as told by your health care provider. °· Keep up with your usual exercises and follow other instructions from your health care provider. °· Eat and drink lightly if you think you are going into labor. °· If Braxton Hicks contractions are making you uncomfortable: °? Change your position from lying down or resting to walking, or change from walking to resting. °? Sit and rest in a tub of warm water. °? Drink enough fluid to keep your urine pale yellow. Dehydration may cause these contractions. °? Do slow and deep breathing several times an hour. °· Keep all follow-up prenatal visits as told by your health care provider. This is important. °Contact a health care provider if: °· You have a fever. °· You have continuous pain in your abdomen. °Get help right away if: °· Your contractions become stronger, more regular, and closer together. °· You have fluid leaking or gushing from your vagina. °· You pass blood-tinged mucus (bloody show). °· You have bleeding from your vagina. °· You have low back pain that you never had before. °· You feel your baby’s head pushing down and causing pelvic pressure. °· Your baby is not moving inside you as much as it used to. °Summary °· Contractions that occur before labor are called Braxton   Hicks contractions, false labor, or practice contractions. °· Braxton Hicks contractions are usually shorter, weaker, farther apart, and less regular than true labor contractions. True labor contractions usually become progressively stronger and regular and they become more frequent. °· Manage discomfort from Braxton Hicks contractions by  changing position, resting in a warm bath, drinking plenty of water, or practicing deep breathing. °This information is not intended to replace advice given to you by your health care provider. Make sure you discuss any questions you have with your health care provider. °Document Released: 08/06/2016 Document Revised: 08/06/2016 Document Reviewed: 08/06/2016 °Elsevier Interactive Patient Education © 2018 [employer] ° °

## 2017-07-08 NOTE — Progress Notes (Signed)
[facility] NST reviewed and reactive with baseline 130, mod variability, + accels, no decels BPP 10/10

## 2017-07-12 ENCOUNTER — Encounter (HOSPITAL_COMMUNITY): Payer: Self-pay

## 2017-07-14 ENCOUNTER — Ambulatory Visit: Payer: Self-pay

## 2017-07-14 ENCOUNTER — Ambulatory Visit (INDEPENDENT_AMBULATORY_CARE_PROVIDER_SITE_OTHER): Payer: Medicaid Other | Admitting: *Deleted

## 2017-07-14 ENCOUNTER — Ambulatory Visit (INDEPENDENT_AMBULATORY_CARE_PROVIDER_SITE_OTHER): Payer: Medicaid Other | Admitting: Obstetrics & Gynecology

## 2017-07-14 VITALS — BP 119/63 | HR 144 | Wt 262.2 lb

## 2017-07-14 DIAGNOSIS — O10919 Unspecified pre-existing hypertension complicating pregnancy, unspecified trimester: Secondary | ICD-10-CM

## 2017-07-14 DIAGNOSIS — O34219 Maternal care for unspecified type scar from previous cesarean delivery: Secondary | ICD-10-CM

## 2017-07-14 DIAGNOSIS — O10913 Unspecified pre-existing hypertension complicating pregnancy, third trimester: Secondary | ICD-10-CM

## 2017-07-14 DIAGNOSIS — O0993 Supervision of high risk pregnancy, unspecified, third trimester: Secondary | ICD-10-CM

## 2017-07-14 DIAGNOSIS — O0990 Supervision of high risk pregnancy, unspecified, unspecified trimester: Secondary | ICD-10-CM

## 2017-07-14 NOTE — Progress Notes (Signed)

## 2017-07-14 NOTE — Progress Notes (Signed)
Rpt C/S scheduled on 4/19

## 2017-07-14 NOTE — Progress Notes (Signed)
   PRENATAL VISIT NOTE  Subjective:  Monica Harding is a 27 y.o. G3P1011 at 6434w0d being seen today for ongoing prenatal care.  She is currently monitored for the following issues for this high-risk pregnancy and has Maternal obesity, antepartum; H/O keloid of skin; Supervision of high risk pregnancy, antepartum; Chronic hypertension during pregnancy, antepartum; History of cesarean delivery, antepartum; LGSIL on Pap smear of cervix; BMI 30-39; Heartburn during pregnancy; and Unwanted fertility on their problem list.  Patient reports no complaints.  Contractions: Not present. Vag. Bleeding: None.  Movement: Present. Denies leaking of fluid.   The following portions of the patient's history were reviewed and updated as appropriate: allergies, current medications, past family history, past medical history, past social history, past surgical history and problem list. Problem list updated.  Objective:   Vitals:   07/14/17 1037  BP: 119/63  Pulse: (!) 144  Weight: 262 lb 3.2 oz (118.9 kg)    Fetal Status: Fetal Heart Rate (bpm): NST   Movement: Present     General:  Alert, oriented and cooperative. Patient is in no acute distress.  Skin: Skin is warm and dry. No rash noted.   Cardiovascular: Normal heart rate noted  Respiratory: Normal respiratory effort, no problems with respiration noted  Abdomen: Soft, gravid, appropriate for gestational age.  Pain/Pressure: Present     Pelvic: Cervical exam deferred        Extremities: Normal range of motion.  Edema: None  Mental Status: Normal mood and affect. Normal behavior. Normal judgment and thought content.   Assessment and Plan:  Pregnancy: G3P1011 at 5734w0d  1. Supervision of high risk pregnancy, antepartum   2. Chronic hypertension during pregnancy, antepartum  - on no meds, good BPs - continue testing -Please note that when she was diagnosed with HTN, she was taking phenteramine for weight loss.  3. History of cesarean delivery,  antepartum - RCS and BTL scheduled for 39 weeks  Term labor symptoms and general obstetric precautions including but not limited to vaginal bleeding, contractions, leaking of fluid and fetal movement were reviewed in detail with the patient. Please refer to After Visit Summary for other counseling recommendations.  No follow-ups on file.  Future Appointments  Date Time Provider Department Center  07/14/2017 11:15 AM Allie Bossierove, Vonzell Lindblad C, MD [facility]  07/21/2017  8:15 AM [facility] NST [facility]  07/21/2017  9:15 AM Conan Bowensavis, Kelly M, MD [facility] WOC  07/22/2017  9:00 AM [facility]SD[facility] PAT 5 [facility]SD[facility] None  07/22/2017  2:15 PM [facility]-MFC US 4 [facility]-MFCUS MFC-US    Allie BossierMyra C Jermia Rigsby, MD

## 2017-07-14 NOTE — Progress Notes (Signed)
I have reviewed this chart and agree with the RN/CMA assessment and management.    Araceli Arango C Wendelin Bradt, MD, FACOG Attending Physician, [facility]  

## 2017-07-20 NOTE — H&P (Signed)
Monica GeneraHeather L Way is an 27 y.o. G3P1011 2573w6d female.   Chief Complaint: Previous C-section  HPI: Patient with one prior c-section and HTN at 39 wks. Desires RCS and BTL.  Past Medical History:  Diagnosis Date  . Chlamydia   . Hypertension   . Vaginal Pap smear, abnormal     Past Surgical History:  Procedure Laterality Date  . CESAREAN SECTION N/A 12/09/2013   Procedure: CESAREAN SECTION;  Surgeon: Lesly DukesKelly H Leggett, MD;  Location: [facility];  Service: Obstetrics;  Laterality: N/A;    Family History  Problem Relation Age of Onset  . Hypertension Mother   . Hypertension Father   . Cancer Maternal Grandmother   . Diabetes Maternal Grandmother   . Cancer Maternal Grandfather   . Diabetes Paternal Grandmother   . Cancer Paternal Grandfather    Social History:  reports that she has never smoked. She has never used smokeless tobacco. She reports that she does not drink alcohol or use drugs.   No Known Allergies  No medications prior to admission.     A comprehensive review of systems was negative.  Last menstrual period 10/21/2016, unknown if currently breastfeeding. General appearance: alert, cooperative and appears stated age Head: Normocephalic, without obvious abnormality, atraumatic Neck: supple, symmetrical, trachea midline Lungs: normal effort Heart: regular rate and rhythm Abdomen: gravid, NT Extremities: extremities normal, atraumatic, no cyanosis or edema Skin: Skin color, texture, turgor normal. No rashes or lesions Neurologic: Grossly normal   Lab Results  Component Value Date   WBC 4.7 05/31/2017   HGB 10.5 (L) 05/31/2017   HCT 32.2 (L) 05/31/2017   MCV 93 05/31/2017   PLT 205 05/31/2017         ABO, Rh: O/Positive/-- (09/17 1336)  Antibody: Negative (09/17 1336)  Rubella: 3.33 (09/17 1336)  RPR: Non Reactive (02/25 0901)  HBsAg: Negative (09/17 1336)  HIV: Non Reactive (02/25 0901)  GBS:       Assessment/Plan Principal Problem:   History of  cesarean delivery, antepartum Active Problems:   Chronic hypertension during pregnancy, antepartum   Unwanted fertility  For RLTCS and BTL Risks include but are not limited to bleeding, infection, injury to surrounding structures, including bowel, bladder and ureters, blood clots, and death.  Likelihood of success is high.   Reva Boresanya S Karilyn Wind 07/20/2017, 7:48 AM

## 2017-07-21 ENCOUNTER — Ambulatory Visit (INDEPENDENT_AMBULATORY_CARE_PROVIDER_SITE_OTHER): Payer: Medicaid Other | Admitting: Obstetrics and Gynecology

## 2017-07-21 ENCOUNTER — Ambulatory Visit: Payer: Self-pay

## 2017-07-21 ENCOUNTER — Ambulatory Visit (INDEPENDENT_AMBULATORY_CARE_PROVIDER_SITE_OTHER): Payer: Medicaid Other | Admitting: *Deleted

## 2017-07-21 VITALS — BP 122/78 | HR 93 | Wt 262.4 lb

## 2017-07-21 DIAGNOSIS — O0990 Supervision of high risk pregnancy, unspecified, unspecified trimester: Secondary | ICD-10-CM

## 2017-07-21 DIAGNOSIS — O10919 Unspecified pre-existing hypertension complicating pregnancy, unspecified trimester: Secondary | ICD-10-CM

## 2017-07-21 DIAGNOSIS — Z3009 Encounter for other general counseling and advice on contraception: Secondary | ICD-10-CM

## 2017-07-21 DIAGNOSIS — O10913 Unspecified pre-existing hypertension complicating pregnancy, third trimester: Secondary | ICD-10-CM

## 2017-07-21 DIAGNOSIS — O34219 Maternal care for unspecified type scar from previous cesarean delivery: Secondary | ICD-10-CM

## 2017-07-21 DIAGNOSIS — R87612 Low grade squamous intraepithelial lesion on cytologic smear of cervix (LGSIL): Secondary | ICD-10-CM

## 2017-07-21 DIAGNOSIS — O0993 Supervision of high risk pregnancy, unspecified, third trimester: Secondary | ICD-10-CM

## 2017-07-21 NOTE — Patient Instructions (Signed)
Monica Harding  07/21/2017   Your procedure is scheduled on:  07/23/2017  [facility]  Pick up the phone at the desk and dial [phone]  Call this number if you have problems the morning of surgery:[phone]  Remember:   Do not eat food:(After Midnight) Desps de medianoche.  Do not drink clear liquids: (After Midnight) Desps de medianoche.  Take these medicines the morning of surgery with A SIP OF WATER: may take protonix   Do not wear jewelry, make-up or nail polish.  Do not wear lotions, powders, or perfumes. Do not wear deodorant.  Do not shave 48 hours prior to surgery.  Do not bring valuables to the hospital.  [facility] is not   responsible for any belongings or valuables brought to the hospital.  Contacts, dentures or bridgework may not be worn into surgery.  Leave suitcase in the car. After surgery it may be brought to your room.  For patients admitted to the hospital, checkout time is 11:00 AM the day of              discharge.    N/A   Please read over the following fact sheets that you were given:   Surgical Site Infection Prevention

## 2017-07-21 NOTE — Progress Notes (Signed)
C/S w/BTS scheduled 07/23/17

## 2017-07-21 NOTE — Progress Notes (Signed)

## 2017-07-21 NOTE — Progress Notes (Signed)
NST reactive. .Baseline: 130 Accelerations: present Decelerations: absent Variability: moderate Interpretation:  Reactive NST  Elsie LincolnKelly Schawn Byas, MD 7:47 AM 07/21/17

## 2017-07-21 NOTE — Progress Notes (Signed)
   PRENATAL VISIT NOTE  Subjective:  Monica Harding is a 27 y.o. G3P1011 at 4542w0d being seen today for ongoing prenatal care.  She is currently monitored for the following issues for this high-risk pregnancy and has Maternal obesity, antepartum; H/O keloid of skin; Supervision of high risk pregnancy, antepartum; Chronic hypertension during pregnancy, antepartum; History of cesarean delivery, antepartum; LGSIL on Pap smear of cervix; BMI 30-39; Heartburn during pregnancy; and Unwanted fertility on their problem list.  Patient reports fatigue.  Contractions: Not present. Vag. Bleeding: None.  Movement: Present. Denies leaking of fluid.   The following portions of the patient's history were reviewed and updated as appropriate: allergies, current medications, past family history, past medical history, past social history, past surgical history and problem list. Problem list updated.  Objective:   Vitals:   07/21/17 0902  BP: 122/78  Pulse: 93  Weight: 262 lb 6.4 oz (119 kg)    Fetal Status: Fetal Heart Rate (bpm): NST   Movement: Present     General:  Alert, oriented and cooperative. Patient is in no acute distress.  Skin: Skin is warm and dry. No rash noted.   Cardiovascular: Normal heart rate noted  Respiratory: Normal respiratory effort, no problems with respiration noted  Abdomen: Soft, gravid, appropriate for gestational age.  Pain/Pressure: Present     Pelvic: Cervical exam deferred        Extremities: Normal range of motion.  Edema: None  Mental Status: Normal mood and affect. Normal behavior. Normal judgment and thought content.   Assessment and Plan:  Pregnancy: G3P1011 at 8642w0d  1. Supervision of high risk pregnancy, antepartum  2. Chronic hypertension during pregnancy, antepartum No BP meds Reactive NST BPP today, not in computer yet  3. History of cesarean delivery, antepartum For RCS + BTL 4/19  4. Unwanted fertility For BTL  5. LGSIL on Pap smear of  cervix colpo pp  Term labor symptoms and general obstetric precautions including but not limited to vaginal bleeding, contractions, leaking of fluid and fetal movement were reviewed in detail with the patient. Please refer to After Visit Summary for other counseling recommendations.  Return in about 1 month (around 08/18/2017) for PP visit.  C/S on 4/19.  Future Appointments  Date Time Provider Department Center  07/22/2017  9:00 AM [facility] PAT 5 [facility] None  08/09/2017  1:30 PM [facility] NURSE [facility] WOC    Conan BowensKelly M Davis, MD

## 2017-07-22 ENCOUNTER — Encounter (HOSPITAL_COMMUNITY)
Admission: RE | Admit: 2017-07-22 | Discharge: 2017-07-22 | Disposition: A | Discharge status: Home / Self Care | Payer: Medicaid Other | Source: Ambulatory Visit | Attending: Family Medicine | Admitting: Family Medicine

## 2017-07-22 ENCOUNTER — Encounter (HOSPITAL_COMMUNITY): Payer: Self-pay

## 2017-07-22 ENCOUNTER — Ambulatory Visit (HOSPITAL_COMMUNITY): Payer: Medicaid Other

## 2017-07-22 LAB — TYPE AND SCREEN
ABO/RH(D): O POS
ANTIBODY SCREEN: NEGATIVE

## 2017-07-22 LAB — CBC
HEMATOCRIT: 35.9 % — AB (ref 36.0–46.0)
Hemoglobin: 11.8 g/dL — ABNORMAL LOW (ref 12.0–15.0)
MCH: 29.9 pg (ref 26.0–34.0)
MCHC: 32.9 g/dL (ref 30.0–36.0)
MCV: 91.1 fL (ref 78.0–100.0)
PLATELETS: 129 10*3/uL — AB (ref 150–400)
RBC: 3.94 MIL/uL (ref 3.87–5.11)
RDW: 14.7 % (ref 11.5–15.5)
WBC: 5.8 10*3/uL (ref 4.0–10.5)

## 2017-07-23 ENCOUNTER — Encounter (HOSPITAL_COMMUNITY)
Admission: RE | Disposition: A | Discharge status: Home / Self Care | Payer: Self-pay | Source: Ambulatory Visit | Attending: Family Medicine

## 2017-07-23 ENCOUNTER — Encounter (HOSPITAL_COMMUNITY): Payer: Self-pay

## 2017-07-23 ENCOUNTER — Encounter (HOSPITAL_COMMUNITY): Payer: Self-pay | Admitting: *Deleted

## 2017-07-23 ENCOUNTER — Inpatient Hospital Stay (HOSPITAL_COMMUNITY): Payer: Medicaid Other

## 2017-07-23 ENCOUNTER — Other Ambulatory Visit: Payer: Self-pay

## 2017-07-23 ENCOUNTER — Inpatient Hospital Stay (HOSPITAL_COMMUNITY)
Admission: RE | Admit: 2017-07-23 | Discharge: 2017-07-26 | DRG: 784 | Disposition: A | Discharge status: Home / Self Care | Payer: Medicaid Other | Source: Ambulatory Visit | Attending: Family Medicine | Admitting: Family Medicine

## 2017-07-23 DIAGNOSIS — O10919 Unspecified pre-existing hypertension complicating pregnancy, unspecified trimester: Secondary | ICD-10-CM | POA: Diagnosis present

## 2017-07-23 DIAGNOSIS — Z3009 Encounter for other general counseling and advice on contraception: Secondary | ICD-10-CM | POA: Diagnosis present

## 2017-07-23 DIAGNOSIS — Z3A39 39 weeks gestation of pregnancy: Secondary | ICD-10-CM

## 2017-07-23 DIAGNOSIS — O1092 Unspecified pre-existing hypertension complicating childbirth: Secondary | ICD-10-CM

## 2017-07-23 DIAGNOSIS — O34219 Maternal care for unspecified type scar from previous cesarean delivery: Secondary | ICD-10-CM | POA: Diagnosis present

## 2017-07-23 DIAGNOSIS — O1002 Pre-existing essential hypertension complicating childbirth: Secondary | ICD-10-CM | POA: Diagnosis present

## 2017-07-23 DIAGNOSIS — O99214 Obesity complicating childbirth: Secondary | ICD-10-CM | POA: Diagnosis present

## 2017-07-23 DIAGNOSIS — O34211 Maternal care for low transverse scar from previous cesarean delivery: Secondary | ICD-10-CM | POA: Diagnosis present

## 2017-07-23 DIAGNOSIS — Z302 Encounter for sterilization: Secondary | ICD-10-CM

## 2017-07-23 DIAGNOSIS — Z87891 Personal history of nicotine dependence: Secondary | ICD-10-CM | POA: Diagnosis not present

## 2017-07-23 LAB — CBC
HCT: 36.1 % (ref 36.0–46.0)
Hemoglobin: 12 g/dL (ref 12.0–15.0)
MCH: 30.2 pg (ref 26.0–34.0)
MCHC: 33.2 g/dL (ref 30.0–36.0)
MCV: 90.7 fL (ref 78.0–100.0)
PLATELETS: 147 10*3/uL — AB (ref 150–400)
RBC: 3.98 MIL/uL (ref 3.87–5.11)
RDW: 14.7 % (ref 11.5–15.5)
WBC: 5.1 10*3/uL (ref 4.0–10.5)

## 2017-07-23 LAB — CREATININE, SERUM
Creatinine, Ser: 0.53 mg/dL (ref 0.44–1.00)
GFR calc Af Amer: >60 mL/min (ref >60)

## 2017-07-23 LAB — RPR: RPR Ser Ql: NONREACTIVE

## 2017-07-23 SURGERY — Surgical Case
Anesthesia: Spinal | Site: Abdomen | Laterality: Bilateral | Wound class: Clean Contaminated

## 2017-07-23 MED ORDER — FENTANYL CITRATE (PF) 100 MCG/2ML IJ SOLN
INTRAMUSCULAR | Status: DC | PRN
  Administered 2017-07-23: 25 ug via INTRATHECAL

## 2017-07-23 MED ORDER — SCOPOLAMINE 1 MG/3DAYS TD PT72
MEDICATED_PATCH | TRANSDERMAL | Status: AC
  Filled 2017-07-23: qty 1

## 2017-07-23 MED ORDER — MEPERIDINE HCL 25 MG/ML IJ SOLN: 6.2500 mg | INTRAMUSCULAR | Status: DC | PRN

## 2017-07-23 MED ORDER — OXYTOCIN 10 UNIT/ML IJ SOLN
INTRAVENOUS | Status: DC | PRN
  Administered 2017-07-23: 40 [IU] via INTRAVENOUS

## 2017-07-23 MED ORDER — FENTANYL CITRATE (PF) 100 MCG/2ML IJ SOLN
INTRAMUSCULAR | Status: AC
  Filled 2017-07-23: qty 2

## 2017-07-23 MED ORDER — OXYTOCIN 40 UNITS IN LACTATED RINGERS INFUSION - SIMPLE MED: 2.5000 [IU]/h | INTRAVENOUS | Status: AC

## 2017-07-23 MED ORDER — PHENYLEPHRINE HCL 10 MG/ML IJ SOLN
INTRAMUSCULAR | Status: DC | PRN
  Administered 2017-07-23 (×2): 80 ug via INTRAVENOUS

## 2017-07-23 MED ORDER — BUPIVACAINE HCL (PF) 0.25 % IJ SOLN
INTRAMUSCULAR | Status: AC
  Filled 2017-07-23: qty 30

## 2017-07-23 MED ORDER — SODIUM CHLORIDE 0.9 % IR SOLN
Status: DC | PRN
  Administered 2017-07-23: 1000 mL

## 2017-07-23 MED ORDER — ONDANSETRON HCL 4 MG/2ML IJ SOLN
INTRAMUSCULAR | Status: AC
  Filled 2017-07-23: qty 2

## 2017-07-23 MED ORDER — OXYCODONE HCL 5 MG PO TABS
10.0000 mg | ORAL_TABLET | ORAL | Status: DC | PRN
  Filled 2017-07-23: qty 2

## 2017-07-23 MED ORDER — COCONUT OIL OIL: 1.0000 "application " | TOPICAL_OIL | Status: DC | PRN

## 2017-07-23 MED ORDER — OXYCODONE HCL 5 MG PO TABS
5.0000 mg | ORAL_TABLET | ORAL | Status: DC | PRN
  Administered 2017-07-24 – 2017-07-25 (×4): 5 mg via ORAL
  Filled 2017-07-23 (×3): qty 1

## 2017-07-23 MED ORDER — LACTATED RINGERS IV SOLN
INTRAVENOUS | Status: DC | PRN
  Administered 2017-07-23: 11:00:00 via INTRAVENOUS

## 2017-07-23 MED ORDER — ENOXAPARIN SODIUM 40 MG/0.4ML ~~LOC~~ SOLN
40.0000 mg | SUBCUTANEOUS | Status: DC
  Filled 2017-07-23 (×2): qty 0.4

## 2017-07-23 MED ORDER — SIMETHICONE 80 MG PO CHEW: 80.0000 mg | CHEWABLE_TABLET | ORAL | Status: DC | PRN

## 2017-07-23 MED ORDER — BUPIVACAINE IN DEXTROSE 0.75-8.25 % IT SOLN
INTRATHECAL | Status: DC | PRN
  Administered 2017-07-23: 1.6 mL via INTRATHECAL

## 2017-07-23 MED ORDER — ONDANSETRON HCL 4 MG/2ML IJ SOLN
4.0000 mg | Freq: Once | INTRAMUSCULAR | Status: AC | PRN
  Administered 2017-07-23: 4 mg via INTRAVENOUS
  Filled 2017-07-23: qty 2

## 2017-07-23 MED ORDER — MEASLES, MUMPS & RUBELLA VAC ~~LOC~~ INJ
0.5000 mL | INJECTION | Freq: Once | SUBCUTANEOUS | Status: DC
  Filled 2017-07-23: qty 0.5

## 2017-07-23 MED ORDER — ACETAMINOPHEN 325 MG PO TABS: 325.0000 mg | ORAL_TABLET | ORAL | Status: DC | PRN

## 2017-07-23 MED ORDER — LACTATED RINGERS IV SOLN
INTRAVENOUS | Status: DC
  Administered 2017-07-23 (×3): via INTRAVENOUS

## 2017-07-23 MED ORDER — STERILE WATER FOR IRRIGATION IR SOLN
Status: DC | PRN
  Administered 2017-07-23: 1000 mL

## 2017-07-23 MED ORDER — SOD CITRATE-CITRIC ACID 500-334 MG/5ML PO SOLN
30.0000 mL | ORAL | Status: AC
  Administered 2017-07-23: 30 mL via ORAL
  Filled 2017-07-23: qty 15

## 2017-07-23 MED ORDER — ONDANSETRON HCL 4 MG/2ML IJ SOLN
INTRAMUSCULAR | Status: DC | PRN
  Administered 2017-07-23: 4 mg via INTRAVENOUS

## 2017-07-23 MED ORDER — OXYCODONE HCL 5 MG PO TABS: 5.0000 mg | ORAL_TABLET | Freq: Once | ORAL | Status: DC | PRN

## 2017-07-23 MED ORDER — BUPIVACAINE HCL (PF) 0.25 % IJ SOLN
INTRAMUSCULAR | Status: DC | PRN
Start: 2017-07-23 — End: 2017-07-23
  Administered 2017-07-23: 30 mL

## 2017-07-23 MED ORDER — DIPHENHYDRAMINE HCL 25 MG PO CAPS: 25.0000 mg | ORAL_CAPSULE | Freq: Four times a day (QID) | ORAL | Status: DC | PRN

## 2017-07-23 MED ORDER — MORPHINE SULFATE (PF) 0.5 MG/ML IJ SOLN
INTRAMUSCULAR | Status: AC
  Filled 2017-07-23: qty 10

## 2017-07-23 MED ORDER — SCOPOLAMINE 1 MG/3DAYS TD PT72
MEDICATED_PATCH | TRANSDERMAL | Status: DC | PRN
  Administered 2017-07-23: 1 via TRANSDERMAL

## 2017-07-23 MED ORDER — ONDANSETRON HCL 4 MG/2ML IJ SOLN: 4.0000 mg | Freq: Once | INTRAMUSCULAR | Status: DC | PRN

## 2017-07-23 MED ORDER — ZOLPIDEM TARTRATE 5 MG PO TABS: 5.0000 mg | ORAL_TABLET | Freq: Every evening | ORAL | Status: DC | PRN

## 2017-07-23 MED ORDER — SIMETHICONE 80 MG PO CHEW
80.0000 mg | CHEWABLE_TABLET | ORAL | Status: DC
  Administered 2017-07-25 – 2017-07-26 (×2): 80 mg via ORAL
  Filled 2017-07-23 (×3): qty 1

## 2017-07-23 MED ORDER — OXYCODONE HCL 5 MG/5ML PO SOLN: 5.0000 mg | Freq: Once | ORAL | Status: DC | PRN

## 2017-07-23 MED ORDER — ACETAMINOPHEN 160 MG/5ML PO SOLN: 325.0000 mg | ORAL | Status: DC | PRN

## 2017-07-23 MED ORDER — CEFAZOLIN SODIUM-DEXTROSE 2-4 GM/100ML-% IV SOLN: 2.0000 g | INTRAVENOUS | Status: DC

## 2017-07-23 MED ORDER — FENTANYL CITRATE (PF) 100 MCG/2ML IJ SOLN: 25.0000 ug | INTRAMUSCULAR | Status: DC | PRN

## 2017-07-23 MED ORDER — OXYTOCIN 10 UNIT/ML IJ SOLN
INTRAMUSCULAR | Status: AC
  Filled 2017-07-23: qty 4

## 2017-07-23 MED ORDER — PRENATAL MULTIVITAMIN CH
1.0000 | ORAL_TABLET | Freq: Every day | ORAL | Status: DC
  Administered 2017-07-24 – 2017-07-25 (×2): 1 via ORAL
  Filled 2017-07-23 (×2): qty 1

## 2017-07-23 MED ORDER — SIMETHICONE 80 MG PO CHEW
80.0000 mg | CHEWABLE_TABLET | Freq: Three times a day (TID) | ORAL | Status: DC
  Administered 2017-07-23 – 2017-07-26 (×7): 80 mg via ORAL
  Filled 2017-07-23 (×7): qty 1

## 2017-07-23 MED ORDER — DEXAMETHASONE SODIUM PHOSPHATE 4 MG/ML IJ SOLN
INTRAMUSCULAR | Status: DC | PRN
  Administered 2017-07-23: 4 mg via INTRAVENOUS

## 2017-07-23 MED ORDER — SENNOSIDES-DOCUSATE SODIUM 8.6-50 MG PO TABS
2.0000 | ORAL_TABLET | ORAL | Status: DC
  Administered 2017-07-24 – 2017-07-26 (×3): 2 via ORAL
  Filled 2017-07-23 (×3): qty 2

## 2017-07-23 MED ORDER — MORPHINE SULFATE (PF) 0.5 MG/ML IJ SOLN
INTRAMUSCULAR | Status: DC | PRN
  Administered 2017-07-23: .2 mg via INTRATHECAL

## 2017-07-23 MED ORDER — ACETAMINOPHEN 325 MG PO TABS: 650.0000 mg | ORAL_TABLET | ORAL | Status: DC | PRN

## 2017-07-23 MED ORDER — TETANUS-DIPHTH-ACELL PERTUSSIS 5-2.5-18.5 LF-MCG/0.5 IM SUSP: 0.5000 mL | Freq: Once | INTRAMUSCULAR | Status: DC

## 2017-07-23 MED ORDER — IBUPROFEN 600 MG PO TABS
600.0000 mg | ORAL_TABLET | Freq: Four times a day (QID) | ORAL | Status: DC
  Administered 2017-07-24 – 2017-07-26 (×9): 600 mg via ORAL
  Filled 2017-07-23 (×10): qty 1

## 2017-07-23 MED ORDER — PHENYLEPHRINE 8 MG IN D5W 100 ML (0.08MG/ML) PREMIX OPTIME
INJECTION | INTRAVENOUS | Status: DC | PRN
  Administered 2017-07-23: 60 ug/min via INTRAVENOUS

## 2017-07-23 MED ORDER — WITCH HAZEL-GLYCERIN EX PADS
1.0000 "application " | MEDICATED_PAD | CUTANEOUS | Status: DC | PRN
  Administered 2017-07-25: 1 via TOPICAL

## 2017-07-23 MED ORDER — DEXTROSE 5 % IN LACTATED RINGERS IV BOLUS
500.0000 mL | Freq: Once | INTRAVENOUS | Status: AC
  Administered 2017-07-23: 500 mL via INTRAVENOUS

## 2017-07-23 MED ORDER — CEFAZOLIN SODIUM-DEXTROSE 2-4 GM/100ML-% IV SOLN
2.0000 g | INTRAVENOUS | Status: AC
  Administered 2017-07-23: 2 g via INTRAVENOUS
  Filled 2017-07-23: qty 100

## 2017-07-23 MED ORDER — DEXTROSE IN LACTATED RINGERS 5 % IV SOLN
INTRAVENOUS | Status: DC
  Administered 2017-07-23: 16:00:00 via INTRAVENOUS
  Administered 2017-07-23: 1 mL via INTRAVENOUS

## 2017-07-23 MED ORDER — MENTHOL 3 MG MT LOZG: 1.0000 | LOZENGE | OROMUCOSAL | Status: DC | PRN

## 2017-07-23 MED ORDER — DEXAMETHASONE SODIUM PHOSPHATE 4 MG/ML IJ SOLN
INTRAMUSCULAR | Status: AC
  Filled 2017-07-23: qty 1

## 2017-07-23 MED ORDER — DIBUCAINE 1 % RE OINT: 1.0000 "application " | TOPICAL_OINTMENT | RECTAL | Status: DC | PRN

## 2017-07-23 MED ORDER — LACTATED RINGERS IV SOLN
INTRAVENOUS | Status: DC
  Administered 2017-07-23: 08:00:00 via INTRAVENOUS

## 2017-07-23 SURGICAL SUPPLY — 30 items
APL SKNCLS STERI-STRIP NONHPOA (GAUZE/BANDAGES/DRESSINGS) ×1
BENZOIN TINCTURE PRP APPL 2/3 (GAUZE/BANDAGES/DRESSINGS) ×3 IMPLANT
CHLORAPREP W/TINT 26ML (MISCELLANEOUS) ×3 IMPLANT
CLAMP CORD UMBIL (MISCELLANEOUS) IMPLANT
CLIP FILSHIE TUBAL LIGA STRL (Clip) ×2 IMPLANT
CLOSURE WOUND 1/2 X4 (GAUZE/BANDAGES/DRESSINGS) ×1
CLOTH BEACON ORANGE TIMEOUT ST (SAFETY) ×3 IMPLANT
DRSG OPSITE POSTOP 4X10 (GAUZE/BANDAGES/DRESSINGS) ×3 IMPLANT
ELECT REM PT RETURN 9FT ADLT (ELECTROSURGICAL) ×3
ELECTRODE REM PT RTRN 9FT ADLT (ELECTROSURGICAL) ×1 IMPLANT
GLOVE BIOGEL PI IND STRL 7.0 (GLOVE) ×3 IMPLANT
GLOVE BIOGEL PI INDICATOR 7.0 (GLOVE) ×6
GLOVE ECLIPSE 7.0 STRL STRAW (GLOVE) ×3 IMPLANT
GOWN STRL REUS W/TWL LRG LVL3 (GOWN DISPOSABLE) ×9 IMPLANT
NEEDLE HYPO 22GX1.5 SAFETY (NEEDLE) ×3 IMPLANT
NS IRRIG 1000ML POUR BTL (IV SOLUTION) ×3 IMPLANT
PACK C SECTION WH (CUSTOM PROCEDURE TRAY) ×3 IMPLANT
PAD ABD 7.5X8 STRL (GAUZE/BANDAGES/DRESSINGS) ×3 IMPLANT
PAD OB MATERNITY 4.3X12.25 (PERSONAL CARE ITEMS) ×3 IMPLANT
PENCIL SMOKE EVAC W/HOLSTER (ELECTROSURGICAL) ×3 IMPLANT
RTRCTR C-SECT PINK 25CM LRG (MISCELLANEOUS) ×2 IMPLANT
SPONGE GAUZE 4X4 12PLY STER LF (GAUZE/BANDAGES/DRESSINGS) ×5 IMPLANT
STRIP CLOSURE SKIN 1/2X4 (GAUZE/BANDAGES/DRESSINGS) ×2 IMPLANT
SUT VIC AB 0 CTX 36 (SUTURE) ×12
SUT VIC AB 0 CTX36XBRD ANBCTRL (SUTURE) ×3 IMPLANT
SUT VIC AB 4-0 KS 27 (SUTURE) ×3 IMPLANT
SYR 30ML LL (SYRINGE) ×3 IMPLANT
TAPE CLOTH SURG 4X10 WHT LF (GAUZE/BANDAGES/DRESSINGS) ×2 IMPLANT
TOWEL OR 17X24 6PK STRL BLUE (TOWEL DISPOSABLE) ×3 IMPLANT
TRAY FOLEY W/BAG SLVR 14FR LF (SET/KITS/TRAYS/PACK) ×3 IMPLANT

## 2017-07-23 NOTE — Addendum Note (Signed)
Addendum  created 07/23/17 1330 by Bethena Midgetddono, Georgi Navarrete, MD   Order list changed, Order sets accessed

## 2017-07-23 NOTE — Anesthesia Postprocedure Evaluation (Signed)
Anesthesia Post Note  Patient: Santa GeneraHeather L Correll  Procedure(s) Performed: REPEAT CESAREAN SECTION WITH BILATERAL TUBAL LIGATION (Bilateral Abdomen)     Patient location during evaluation: Mother Baby Anesthesia Type: Spinal Level of consciousness: awake Pain management: satisfactory to patient Vital Signs Assessment: post-procedure vital signs reviewed and stable Respiratory status: spontaneous breathing Cardiovascular status: stable Anesthetic complications: no    Last Vitals:  Vitals:   07/23/17 1600 07/23/17 1610  BP: (!) 151/80 130/71  Pulse: 100   Resp: 20   Temp:    SpO2: 100%     Last Pain:  Vitals:   07/23/17 1600  TempSrc:   PainSc: 2    Pain Goal:                 KeyCorpBURGER,Quetzaly Ebner

## 2017-07-23 NOTE — Anesthesia Preprocedure Evaluation (Signed)
Anesthesia Evaluation  Patient identified by MRN, date of birth, ID band Patient awake    Reviewed: Allergy & Precautions, H&P , NPO status , Patient's Chart, lab work & pertinent test results  History of Anesthesia Complications Negative for: history of anesthetic complications  Airway Mallampati: II  TM Distance: >3 FB Neck ROM: full    Dental no notable dental hx. (+) Teeth Intact   Pulmonary neg pulmonary ROS, former smoker,    Pulmonary exam normal breath sounds clear to auscultation       Cardiovascular hypertension, negative cardio ROS   Rhythm:regular Rate:Normal     Neuro/Psych negative neurological ROS  negative psych ROS   GI/Hepatic negative GI ROS, Neg liver ROS,   Endo/Other  negative endocrine ROSMorbid obesity  Renal/GU negative Renal ROS  negative genitourinary   Musculoskeletal   Abdominal Normal abdominal exam  (+)   Peds  Hematology negative hematology ROS (+)   Anesthesia Other Findings   Reproductive/Obstetrics (+) Pregnancy                             Anesthesia Physical  Anesthesia Plan  ASA: II  Anesthesia Plan: Spinal   Post-op Pain Management:    Induction:   PONV Risk Score and Plan: 2  Airway Management Planned: Nasal Cannula  Additional Equipment:   Intra-op Plan:   Post-operative Plan:   Informed Consent: I have reviewed the patients History and Physical, chart, labs and discussed the procedure including the risks, benefits and alternatives for the proposed anesthesia with the patient or authorized representative who has indicated his/her understanding and acceptance.     Plan Discussed with: CRNA, Anesthesiologist and Surgeon  Anesthesia Plan Comments:         Anesthesia Quick Evaluation

## 2017-07-23 NOTE — Addendum Note (Signed)
Addendum  created 07/23/17 1839 by Algis GreenhouseBurger, Vladimir Lenhoff A, CRNA   Sign clinical note

## 2017-07-23 NOTE — Transfer of Care (Signed)
Immediate Anesthesia Transfer of Care Note  Patient: Monica Harding  Procedure(s) Performed: REPEAT CESAREAN SECTION WITH BILATERAL TUBAL LIGATION (Bilateral Abdomen)  Patient Location: PACU  Anesthesia Type:Spinal  Level of Consciousness: awake and alert   Airway & Oxygen Therapy: Patient Spontanous Breathing  Post-op Assessment: Report given to RN and Post -op Vital signs reviewed and stable  Post vital signs: Reviewed  Last Vitals:  Vitals Value Taken Time  BP    Temp    Pulse 84 07/23/2017 11:51 AM  Resp 14 07/23/2017 11:51 AM  SpO2 100 % 07/23/2017 11:51 AM  Vitals shown include unvalidated device data.  Last Pain:  Vitals:   07/23/17 0822  TempSrc: Oral  PainSc: 0-No pain         Complications: No apparent anesthesia complications

## 2017-07-23 NOTE — Anesthesia Procedure Notes (Signed)
Spinal  Patient location during procedure: OR Start time: 07/23/2017 10:30 AM End time: 07/23/2017 10:30 AM Staffing Anesthesiologist: Bethena Midgetddono, Ernest, MD Performed: anesthesiologist  Preanesthetic Checklist Completed: patient identified, site marked, surgical consent, pre-op evaluation, timeout performed, IV checked, risks and benefits discussed and monitors and equipment checked Spinal Block Patient position: sitting Prep: DuraPrep Patient monitoring: heart rate, continuous pulse ox and blood pressure Approach: midline Location: L3-4 Injection technique: single-shot Needle Needle type: Pencan  Needle gauge: 24 G Needle length: 10 cm Assessment Sensory level: T4

## 2017-07-23 NOTE — Interval H&P Note (Signed)
History and Physical Interval Note:  07/23/2017 8:43 AM  Monica Harding  has presented today for surgery, with the diagnosis of RCS  Undesired Fertility  The various methods of treatment have been discussed with the patient and family. After consideration of risks, benefits and other options for treatment, the patient has consented to  Procedure(s): REPEAT CESAREAN SECTION WITH BILATERAL TUBAL LIGATION (N/A) as a surgical intervention .  The patient's history has been reviewed, patient examined, no change in status, stable for surgery. Patient counseled, r.e. Risks benefits of BTL, including permanency of procedure, risk of failure(1:100), increased risk of ectopic.  Patient verbalized understanding and desires to proceed  I have reviewed the patient's chart and labs.  Questions were answered to the patient's satisfaction.     Reva Boresanya S Nakisha Chai

## 2017-07-23 NOTE — Anesthesia Postprocedure Evaluation (Signed)
Anesthesia Post Note  Patient: Monica Harding  Procedure(s) Performed: REPEAT CESAREAN SECTION WITH BILATERAL TUBAL LIGATION (Bilateral Abdomen)     Patient location during evaluation: PACU Anesthesia Type: Spinal Level of consciousness: oriented and awake and alert Pain management: pain level controlled Vital Signs Assessment: post-procedure vital signs reviewed and stable Respiratory status: spontaneous breathing, respiratory function stable and patient connected to nasal cannula oxygen Cardiovascular status: blood pressure returned to baseline and stable Postop Assessment: no headache, no backache and no apparent nausea or vomiting Anesthetic complications: no    Last Vitals:  Vitals:   07/23/17 1230 07/23/17 1238  BP: (!) 141/79   Pulse: (!) 106 91  Resp: (!) 24 19  Temp:    SpO2: 100% 99%    Last Pain:  Vitals:   07/23/17 1151  TempSrc:   PainSc: 0-No pain   Pain Goal:                 Neythan Kozlov

## 2017-07-23 NOTE — Progress Notes (Addendum)
Called due to increased bleeding and slight staining of patient's gown. Per reports patient had just vomited prior to the bleeding started. Went to evaluated patient. Some staining on hospital gown. Abdomen soft, with no signs of active bleeding. Little staining on pressure and honeycomb. Ok to leave in place.  Likely due to the increased pressure from vomiting the patient dislodged a clot. This lead to active bleeding for short period of time. The bleeding reclotted and resolved. Cbc already ordered for AM. Will continue to monitor.  Myrene BuddyJacob Cordie Buening MD PGY-1 Family Medicine Resident

## 2017-07-23 NOTE — Op Note (Addendum)
Preoperative Diagnosis:  IUP @ 5954w2d, Previous C-section and undesired fertility  Postoperative Diagnosis:  Same  Procedure: Repeat low transverse cesarean section, Bilateral Tubal Ligation with Filshie clips  Surgeon: Tinnie Gensanya Trinidi Toppins, M.D.  Assistant: Caryl AdaJazma Phelps, MD An experienced assistant was required given the standard of surgical care and the complexity of the case.  This assistant was needed for exposure, dissection, suctioning, retraction, instrument exchange, and for fetal delivery.  Anesthesia: spinal with Bethena Midgetddono, Ernest, MD  Findings: Viable female infant, APGAR and weight pending Normal tubes and ovaries  Estimated blood loss: 1000 cc  Complications: None known  Specimens: Placenta to labor and delivery  Reason for procedure: Briefly, the patient is a 27 y.o. G3P1011 at 4454w2d with h/o previous C-section who desires permanent sterility.  Patient counseled, r.e. Risks benefits of BTL, including permanency of procedure, risk of failure(1:100), increased risk of ectopic.  Patient verbalized understanding and desires to proceed   Procedure: Patient is a to the OR where spinal analgesia was administered. She was then placed in a supine position with left lateral tilt. She received 2 g of Ancef and SCDs were in place. A Foley catheter was placed in the bladder. She was prepped and draped in the usual sterile fashion. A timeout was performed. A knife was then used to make a Pfannenstiel incision. This incision was carried out to underlying fascia which was divided in the midline with the knife. The incision was extended laterally, sharply. The fascia was dissected of the underlying rectus superiorly.  The rectus was divided in the midline. The peritoneal cavity was entered sharply. Adhesions of omentum taken down from the right rectus.  Alexis retractor was placed inside the incision. Adhesions on the uterus taken down with the electrocautery.  A knife was used to make a low transverse incision  on the uterus. This incision was carried down to the amniotic cavity was entered. Fetus was in LOP position and was brought up out of the incision with some difficulty in extricating the non-molded head. Cord was clamped x 2 and cut. Infant taken to waiting pediatrician. Cord blood was obtained. Placenta was delivered from the uterus.  Uterus was cleaned with dry lap pads. Uterine incision closed with 0 Vicryl suture in a locked running fashion. Attention was turned to the pt's left tube which was grasped with a Babcock clamp and followed to its fimbriated end.  A Filshie clip was placed across the tube 1.5 cm from the cornu.  Attention was turned to the pt's right tube which was grasped with a Babcock clamp and followed to its fimbriated end.  A Filshie clip was placed across the tube 1.5 cm from the cornu. A 1cc of 0.25% Marcaine was injected into the surrounding tubes bilaterally. A figure of eight placed in the middle of the uterine incision to achieve hemostasis. Alexis retractor was removed from the abdomen. Peritoneal closure was done with 0 Vicryl suture.  Fascia is closed with 0 Vicryl suture in a running fashion. Subcutaneous tissue infused with 30cc 0.25% Marcaine.  Subcutaneous closure was performed with 0 Vicryl suture.  Skin closed using 3-0 Vicryl on a Keith needle.  Steri strips applied, followed by pressure dressing.  All instrument, needle and lap counts were correct x 2.  Patient was awake and taken to PACU stable.  Infant remained with mom in couplet care, stable.

## 2017-07-24 LAB — CBC
HCT: 34.2 % — ABNORMAL LOW (ref 36.0–46.0)
HEMOGLOBIN: 11.3 g/dL — AB (ref 12.0–15.0)
MCH: 30.3 pg (ref 26.0–34.0)
MCHC: 33 g/dL (ref 30.0–36.0)
MCV: 91.7 fL (ref 78.0–100.0)
PLATELETS: 135 10*3/uL — AB (ref 150–400)
RBC: 3.73 MIL/uL — AB (ref 3.87–5.11)
RDW: 14.7 % (ref 11.5–15.5)
WBC: 9.1 10*3/uL (ref 4.0–10.5)

## 2017-07-24 LAB — RPR: RPR: NONREACTIVE

## 2017-07-24 NOTE — Lactation Note (Signed)
This note was copied from a baby's chart. Lactation Consultation Note Baby 19 hrs old. Mom states BF well. Mom BF her now 493 yr old for 2 months breast/formula feeding. Mom is breast/formula feeding this baby. Mom has large pendulous breast w/everted nipples at the bottom end of he breast. Mom demonstrated hand expression that squirted colostrum.  Newborn feeding habits, I&O, STS, cluster feeding, supply and demand discussed. Encouraged to BF first before formula feed. Mom encouraged to feed baby 8-12 times/24 hours and with feeding cues.  Encouraged to call for assistance or questions.  [facility] brochure given w/resources, support groups and LC services.  Patient Name: Monica Harding'VToday's Date: 07/24/2017 Reason for consult: Initial assessment   Maternal Data Has patient been taught Hand Expression?: Yes Does the patient have breastfeeding experience prior to this delivery?: Yes  Feeding Feeding Type: Breast Fed Length of feed: 20 min  LATCH Score       Type of Nipple: Everted at rest and after stimulation  Comfort (Breast/Nipple): Soft / non-tender        Interventions Interventions: Breast feeding basics reviewed;Position options;Breast massage;Hand express;Breast compression  Lactation Tools Discussed/Used WIC Program: Yes   Consult Status Consult Status: Follow-up Date: 07/25/17 Follow-up type: In-patient    [name], [name] 07/24/2017, 6:39 AM

## 2017-07-24 NOTE — Progress Notes (Signed)
Subjective: Postpartum Day 1: Cesarean Delivery Eating, drinking, voiding, ambulating well.  No flatus yet.  Lochia and pain wnl.  Denies dizziness, lightheadedness, or sob. No complaints.    Objective: Vital signs in last 24 hours: Temp:  [97.7 F (36.5 C)-99.5 F (37.5 C)] 98.6 F (37 C) (04/20 0400) Pulse Rate:  [62-106] 65 (04/20 0400) Resp:  [13-26] 20 (04/19 1600) BP: (90-151)/(56-80) 109/73 (04/20 0400) SpO2:  [97 %-100 %] 97 % (04/20 0000)  Physical Exam:  General: alert, cooperative and no distress Lochia: appropriate Uterine Fundus: firm Incision: healing well, no dehiscence, no significant erythema; drainage slightly outside of previously marked borders- will have nurse change dressing and monitor DVT Evaluation: No evidence of DVT seen on physical exam. Negative Homan's sign. No cords or calf tenderness. No significant calf/ankle edema. Continue Lovenox  Recent Labs    07/23/17 0810 07/24/17 0532  HGB 12.0 11.3*  HCT 36.1 34.2*    Assessment/Plan: Status post Cesarean section. Doing well postoperatively.  Continue current care. Breast & bottlefeeding Plans outpatient circ  Cheral MarkerKimberly R Christen Wardrop 07/24/2017, 11:05 AM

## 2017-07-25 MED ORDER — OXYCODONE HCL 5 MG PO TABS: 5.0000 mg | ORAL_TABLET | ORAL | 0 refills | Status: AC | PRN

## 2017-07-25 NOTE — Lactation Note (Signed)
This note was copied from a baby's chart. Lactation Consultation Note  LC to assess feeding. Mother has been latching infant in cradle hold with infant and describing a pain scale of #6 when latched. Mother has a good flow of colostrum when hand expresses. Mothers breast are large, very soft and compressible. Nipples are semi-flat when tissue is compressed.   Mother taught to use a cross cradle hold with good pillow support and good alignment. Infant latched on with assistance on the left breast.  Infant sustained latch for 10 mins with good burst of suckling and audible swallows. Mother denies having any pain. She reports that infant is on the breast much deeper. Infant then latched on the right breast in football hold. Infant latched on with a shallow latch. Several attempts to get good depth. Infant observed with a few swallows and some clicking with suckling.   Observed that infant has a anterior frenula,but stretches well and pulls gloved finger in. Tongue has limited lateral movement. Mother advised to limit use of pacifier and feed infant on cue at least 8-12 times in 24 hours.  Encouraged mother to follow up with LC as needed .   Patient Name: Monica Harding ZOXWR'UToday's Date: 07/25/2017 Reason for consult: Follow-up assessment   Maternal Data    Feeding Feeding Type: Breast Fed Length of feed: 9 min  LATCH Score Latch: Repeated attempts needed to sustain latch, nipple held in mouth throughout feeding, stimulation needed to elicit sucking reflex.  Audible Swallowing: A few with stimulation  Type of Nipple: Everted at rest and after stimulation  Comfort (Breast/Nipple): Soft / non-tender  Hold (Positioning): Assistance needed to correctly position infant at breast and maintain latch.  LATCH Score: 7  Interventions    Lactation Tools Discussed/Used Tools: Pump Breast pump type: Manual   Consult Status Consult Status: Follow-up Date: 07/25/17 Follow-up type:  In-patient    Stevan BornKendrick, Nattalie Santiesteban [facility] 07/25/2017, 1:01 PM

## 2017-07-25 NOTE — Discharge Summary (Signed)
OB Discharge Summary     Patient Name: Monica Harding DOB: [birth year] MRN: [id number]  Date of admission: 07/23/2017 Delivering MD: Reva Bores   Date of discharge: 07/25/2017  Admitting diagnosis: RCS  Undesired Fertility Intrauterine pregnancy: [redacted]w[redacted]d     Secondary diagnosis:  Principal Problem:   History of cesarean delivery, antepartum Active Problems:   Chronic hypertension during pregnancy, antepartum   Unwanted fertility   Postpartum care following cesarean delivery  Additional problems: none     Discharge diagnosis: Term Pregnancy Delivered                                                                                                Post partum procedures:postpartum tubal ligation  Augmentation: none  Complications: None  Hospital course:  Sceduled C/S   27 y.o. yo [id number] at [redacted]w[redacted]d was admitted to the hospital 07/23/2017 for scheduled cesarean section with the following indication:Elective Repeat.  Membrane Rupture Time/Date: 11:01 AM ,07/23/2017   Patient delivered a Viable infant.07/23/2017  Details of operation can be found in separate operative note.  Pateint had an uncomplicated postpartum course.  She is ambulating, tolerating a regular diet, passing flatus, and urinating well. Patient is discharged home in stable condition on  07/25/17         Physical exam  Vitals:   07/24/17 0000 07/24/17 0400 07/24/17 1859 07/25/17 0616  BP: (!) 103/56 109/73 (!) 124/50 135/75  Pulse: 62 65 77 70  Resp:   16 16  Temp: 99 F (37.2 C) 98.6 F (37 C) 98.1 F (36.7 C) 98.6 F (37 C)  TempSrc: Oral Oral Oral Oral  SpO2: 97%  100%   Weight:      Height:       General: alert, cooperative and no distress Lochia: appropriate Uterine Fundus: firm Incision: Healing well with no significant drainage, No significant erythema, Dressing is clean, dry, and intact DVT Evaluation: No evidence of DVT seen on physical exam. No cords or calf tenderness. No significant  calf/ankle edema. Labs: Lab Results  Component Value Date   WBC 9.1 07/24/2017   HGB 11.3 (L) 07/24/2017   HCT 34.2 (L) 07/24/2017   MCV 91.7 07/24/2017   PLT 135 (L) 07/24/2017   CMP Latest Ref Rng & Units 07/23/2017  Creatinine 0.44 - 1.00 mg/dL 1.47    Discharge instruction: per After Visit Summary and "Baby and Me Booklet".  After visit meds:  Allergies as of 07/25/2017   No Known Allergies     Medication List    TAKE these medications   acetaminophen 500 MG tablet Commonly known as:  TYLENOL Take 1,000 mg by mouth daily as needed for moderate pain or headache.   oxyCODONE 5 MG immediate release tablet Commonly known as:  Oxy IR/ROXICODONE Take 1 tablet (5 mg total) by mouth every 4 (four) hours as needed for up to 7 days (pain scale 4-7).   pantoprazole 20 MG tablet Commonly known as:  PROTONIX TAKE 1 TABLET BY MOUTH EVERY DAY   Prenatal Vitamins 0.8 MG tablet Take 1 tablet by mouth daily.  Diet: routine diet  Activity: Advance as tolerated. Pelvic rest for 6 weeks.   Outpatient follow up:2 weeks Follow up Appt: Future Appointments  Date Time Provider Department Center  08/09/2017  1:30 PM [facility] NURSE [facility]   Follow up Visit:No follow-ups on file.  Postpartum contraception: Tubal Ligation  Newborn Data: Live born female  Birth Weight: 6 lb 12.2 oz (3067 g) APGAR: 8, 10  Newborn Delivery   Birth date/time:  07/23/2017 11:02:00 Delivery type:  C-Section, Low Transverse Trial of labor:  No C-section categorization:  Repeat     Baby Feeding: Bottle and Breast Disposition:home with mother   07/25/2017 Sharyon CableVeronica C Charlestine Rookstool, CNM

## 2017-07-26 ENCOUNTER — Encounter (HOSPITAL_COMMUNITY): Payer: Self-pay

## 2017-07-26 NOTE — Discharge Instructions (Signed)
Cesarean Delivery Cesarean birth, or cesarean delivery, is the surgical delivery of a baby through an incision in the abdomen and the uterus. This may be referred to as a C-section. This procedure may be scheduled ahead of time, or it may be done in an emergency situation. Tell a health care provider about:  Any allergies you have.  All medicines you are taking, including vitamins, herbs, eye drops, creams, and over-the-counter medicines.  Any problems you or family members have had with anesthetic medicines.  Any blood disorders you have.  Any surgeries you have had.  Any medical conditions you have.  Whether you or any members of your family have a history of deep vein thrombosis (DVT) or pulmonary embolism (PE). What are the risks? Generally, this is a safe procedure. However, problems may occur, including:  Infection.  Bleeding.  Allergic reactions to medicines.  Damage to other structures or organs.  Blood clots.  Injury to your baby.  What happens before the procedure?  Follow instructions from your health care provider about eating or drinking restrictions.  Follow instructions from your health care provider about bathing before your procedure to help reduce your risk of infection.  If you know that you are going to have a cesarean delivery, do not shave your pubic area. Shaving before the procedure may increase your risk of infection.  Ask your health care provider about: ? Changing or stopping your regular medicines. This is especially important if you are taking diabetes medicines or blood thinners. ? Your pain management plan. This is especially important if you plan to breastfeed your baby. ? How long you will be in the hospital after the procedure. ? Any concerns you may have about receiving blood products if you need them during the procedure. ? Cord blood banking, if you plan to collect your baby's umbilical cord blood.  You may also want to ask your  health care provider: ? Whether you will be able to hold or breastfeed your baby while you are still in the operating room. ? Whether your baby can stay with you immediately after the procedure and during your recovery. ? Whether a family member or a person of your choice can go with you into the operating room and stay with you during the procedure, immediately after the procedure, and during your recovery.  Plan to have someone drive you home when you are discharged from the hospital. What happens during the procedure?  Fetal monitors will be placed on your abdomen to monitor your heart rate and your baby's heart rate.  Depending on the reason for your cesarean delivery, you may have a physical exam or additional testing, such as an ultrasound.  An IV tube will be inserted into one of your veins.  You may have your blood or urine tested.  You will be given antibiotic medicine to help prevent infection.  You may be given a special warming gown to wear to keep your temperature stable.  Hair may be removed from your pubic area.  The skin of your pubic area and lower abdomen will be cleaned with a germ-killing solution (antiseptic).  A catheter may be inserted into your bladder through your urethra. This drains your urine during the procedure.  You may be given one or more of the following: ? A medicine to numb the area (local anesthetic). ? A medicine to make you fall asleep (general anesthetic). ? A medicine (regional anesthetic) that is injected into your back or through a small   thin tube placed in your back (spinal anesthetic or epidural anesthetic). This numbs everything below the injection site and allows you to stay awake during your procedure. If this makes you feel nauseous, tell your health care provider. Medicines will be available to help reduce any nausea you may feel.  An incision will be made in your abdomen, and then in your uterus.  If you are awake during your  procedure, you may feel tugging and pulling in your abdomen, but you should not feel pain. If you feel pain, tell your health care provider immediately.  Your baby will be removed from your uterus. You may feel more pressure or pushing while this happens.  Immediately after birth, your baby will be dried and kept warm. You may be able to hold and breastfeed your baby. The umbilical cord may be clamped and cut during this time.  Your placenta will be removed from your uterus.  Your incisions will be closed with stitches (sutures). Staples, skin glue, or adhesive strips may also be applied to the incision in your abdomen.  Bandages (dressings) will be placed over the incision in your abdomen. The procedure may vary among health care providers and hospitals. What happens after the procedure?  Your blood pressure, heart rate, breathing rate, and blood oxygen level will be monitored often until the medicines you were given have worn off.  You may continue to receive fluids and medicines through an IV tube.  You will have some pain. Medicines will be available to help control your pain.  To help prevent blood clots: ? You may be given medicines. ? You may have to wear compression stockings or devices. ? You will be encouraged to walk around when you are able.  Hospital staff will encourage and support bonding with your baby. Your hospital may allow you and your baby to stay in the same room (rooming in) during your hospital stay to encourage successful breastfeeding.  You may be encouraged to cough and breathe deeply often. This helps to prevent lung problems.  If you have a catheter draining your urine, it will be removed as soon as possible after your procedure. This information is not intended to replace advice given to you by your health care provider. Make sure you discuss any questions you have with your health care provider. Document Released: 03/23/2005 Document Revised: 08/29/2015  Document Reviewed: 01/01/2015 [employer] Interactive Patient Education  2018 [employer]  

## 2017-07-26 NOTE — Discharge Summary (Signed)
OB Discharge Summary  Patient Name: Monica Harding DOB: 25-May-1990 MRN: [id number]  Date of admission: 07/23/2017 Delivering MD: Reva Bores   Date of discharge: 07/26/2017  Admitting diagnosis: RCS  Undesired Fertility Intrauterine pregnancy: [redacted]w[redacted]d     Secondary diagnosis:Principal Problem:   History of cesarean delivery, antepartum Active Problems:   Chronic hypertension during pregnancy, antepartum   Unwanted fertility   Postpartum care following cesarean delivery  Additional problems:None     Discharge diagnosis: Term Pregnancy Delivered                                                                     Post partum procedures:none  Complications: None  Hospital course:  Sceduled C/S   27 y.o. yo [id number] at [redacted]w[redacted]d was admitted to the hospital 07/23/2017 for scheduled cesarean section with the following indication:Elective Repeat.  Membrane Rupture Time/Date: 11:01 AM ,07/23/2017   Patient delivered a Viable infant.07/23/2017  Details of operation can be found in separate operative note.  Pateint had an uncomplicated postpartum course.  She is ambulating, tolerating a regular diet, passing flatus, and urinating well. Patient is discharged home in stable condition on  07/26/17         Physical exam  Vitals:   07/25/17 0616 07/25/17 1714 07/25/17 2201 07/26/17 0607  BP: 135/75 129/75 139/89 128/72  Pulse: 70 78 71 66  Resp: 16 16 16 16   Temp: 98.6 F (37 C) 98.3 F (36.8 C) 97.9 F (36.6 C) 98.2 F (36.8 C)  TempSrc: Oral Oral Oral Oral  SpO2:   99%   Weight:      Height:       General: alert, cooperative and no distress Lochia: appropriate Uterine Fundus: firm Incision: Healing well with no significant drainage, Dressing is clean, dry, and intact DVT Evaluation: No evidence of DVT seen on physical exam. Labs: Lab Results  Component Value Date   WBC 9.1 07/24/2017   HGB 11.3 (L) 07/24/2017   HCT 34.2 (L) 07/24/2017   MCV 91.7 07/24/2017   PLT 135  (L) 07/24/2017   CMP Latest Ref Rng & Units 07/23/2017  Creatinine 0.44 - 1.00 mg/dL 2.95    Discharge instruction: per After Visit Summary and "Baby and Me Booklet".  After Visit Meds:  Allergies as of 07/26/2017   No Known Allergies     Medication List    TAKE these medications   acetaminophen 500 MG tablet Commonly known as:  TYLENOL Take 1,000 mg by mouth daily as needed for moderate pain or headache.   oxyCODONE 5 MG immediate release tablet Commonly known as:  Oxy IR/ROXICODONE Take 1 tablet (5 mg total) by mouth every 4 (four) hours as needed for up to 7 days (pain scale 4-7).   pantoprazole 20 MG tablet Commonly known as:  PROTONIX TAKE 1 TABLET BY MOUTH EVERY DAY   Prenatal Vitamins 0.8 MG tablet Take 1 tablet by mouth daily.       Diet: routine diet  Activity: Advance as tolerated. Pelvic rest for 6 weeks.   Outpatient follow up:2 weeks Follow up Appt: Future Appointments  Date Time Provider Department Center  08/09/2017  1:30 PM [facility] NURSE [facility]   Follow up visit: No  follow-ups on file.  Postpartum contraception: Tubal Ligation  Newborn Data: Live born female  Birth Weight: 6 lb 12.2 oz (3067 g) APGAR: 8, 10  Newborn Delivery   Birth date/time:  07/23/2017 11:02:00 Delivery type:  C-Section, Low Transverse Trial of labor:  No C-section categorization:  Repeat     Baby Feeding: Bottle and Breast Disposition:home with mother   07/26/2017 Reva Boresanya S Masai Kidd, MD

## 2017-07-26 NOTE — Lactation Note (Addendum)
This note was copied from a baby's chart. Lactation Consultation Note  Patient Name: Monica Ileene RubensHeather Harding ZOXWR'UToday's Date: 07/26/2017 Reason for consult: Follow-up assessment;Infant weight loss   Baby 70 hours old and sleeping in bed.  Discussed safe sleep w/ mother. 10% weight loss. MD aware of oral assessment with short lingual frenulum per mother. Mother is breastfeeding and then giving breastmilk and formula supplement to infant. Reviewed increased volume guidelines w/ mother 20-30 ml increasing per day of life and as baby desires. Mother is pumping approx 10 ml w/ manual pump. Suggest giving that first with the difference w/ formula. Mom has my # to call for assist w/next feeding. Mom encouraged to feed baby 8-12 times/24 hours and with feeding cues at least q 3 hours.  RN assisted w/ feeding this morning and in agreement w/ feeding plan.      Maternal Data    Feeding Feeding Type: Breast Milk with Formula added Length of feed: 10 min  LATCH Score                   Interventions    Lactation Tools Discussed/Used Tools: Pump Breast pump type: Double-Electric Breast Pump   Consult Status Consult Status: Follow-up Date: 07/26/17 Follow-up type: In-patient    Dahlia ByesBerkelhammer, Carriann Hesse [facility] 07/26/2017, 9:10 AM

## 2017-08-09 ENCOUNTER — Ambulatory Visit (INDEPENDENT_AMBULATORY_CARE_PROVIDER_SITE_OTHER): Payer: Medicaid Other

## 2017-08-09 ENCOUNTER — Inpatient Hospital Stay (HOSPITAL_COMMUNITY)
Admission: AD | Admit: 2017-08-09 | Discharge: 2017-08-09 | Disposition: A | Discharge status: Home / Self Care | Payer: Medicaid Other | Source: Ambulatory Visit | Attending: Obstetrics & Gynecology | Admitting: Obstetrics & Gynecology

## 2017-08-09 VITALS — BP 158/93 | HR 64 | Wt 249.3 lb

## 2017-08-09 DIAGNOSIS — R030 Elevated blood-pressure reading, without diagnosis of hypertension: Secondary | ICD-10-CM | POA: Diagnosis present

## 2017-08-09 DIAGNOSIS — O1003 Pre-existing essential hypertension complicating the puerperium: Secondary | ICD-10-CM | POA: Diagnosis not present

## 2017-08-09 DIAGNOSIS — Z5189 Encounter for other specified aftercare: Secondary | ICD-10-CM

## 2017-08-09 DIAGNOSIS — Z9889 Other specified postprocedural states: Secondary | ICD-10-CM

## 2017-08-09 DIAGNOSIS — I10 Essential (primary) hypertension: Secondary | ICD-10-CM

## 2017-08-09 LAB — CBC
HCT: 43.2 % (ref 36.0–46.0)
Hemoglobin: 13.8 g/dL (ref 12.0–15.0)
MCH: 29.4 pg (ref 26.0–34.0)
MCHC: 31.9 g/dL (ref 30.0–36.0)
MCV: 91.9 fL (ref 78.0–100.0)
PLATELETS: 134 10*3/uL — AB (ref 150–400)
RBC: 4.7 MIL/uL (ref 3.87–5.11)
RDW: 13.8 % (ref 11.5–15.5)
WBC: 4 10*3/uL (ref 4.0–10.5)

## 2017-08-09 LAB — COMPREHENSIVE METABOLIC PANEL
ALT: 17 U/L (ref 14–54)
AST: 19 U/L (ref 15–41)
Albumin: 3.6 g/dL (ref 3.5–5.0)
Alkaline Phosphatase: 75 U/L (ref 38–126)
Anion gap: 7 (ref 5–15)
BUN: 11 mg/dL (ref 6–20)
CHLORIDE: 103 mmol/L (ref 101–111)
CO2: 27 mmol/L (ref 22–32)
Calcium: 9 mg/dL (ref 8.9–10.3)
Creatinine, Ser: 0.69 mg/dL (ref 0.44–1.00)
GFR calc Af Amer: >60 mL/min (ref >60)
Glucose, Bld: 91 mg/dL (ref 65–99)
POTASSIUM: 4.3 mmol/L (ref 3.5–5.1)
SODIUM: 137 mmol/L (ref 135–145)
Total Bilirubin: 0.4 mg/dL (ref 0.3–1.2)
Total Protein: 6.9 g/dL (ref 6.5–8.1)

## 2017-08-09 LAB — PROTEIN / CREATININE RATIO, URINE: CREATININE, URINE: 78 mg/dL

## 2017-08-09 MED ORDER — NIFEDIPINE ER OSMOTIC RELEASE 30 MG PO TB24: 30.0000 mg | ORAL_TABLET | Freq: Every day | ORAL | 1 refills | Status: DC

## 2017-08-09 MED ORDER — NIFEDIPINE 10 MG PO CAPS: 30.0000 mg | ORAL_CAPSULE | ORAL | Status: DC

## 2017-08-09 MED ORDER — NIFEDIPINE 10 MG PO CAPS
20.0000 mg | ORAL_CAPSULE | ORAL | Status: AC
  Administered 2017-08-09: 20 mg via ORAL
  Filled 2017-08-09: qty 2

## 2017-08-09 MED ORDER — NIFEDIPINE 10 MG PO CAPS: 30.0000 mg | ORAL_CAPSULE | Freq: Once | ORAL | Status: DC

## 2017-08-09 NOTE — Discharge Instructions (Signed)

## 2017-08-09 NOTE — Progress Notes (Signed)
Pt here today for incision check s/p c-section on 07/23/17.  Pt denies any pain or bleeding.  Pt reports not having an appetite I encouraged pt to eat non-greasy foods and snacks.  Pt presents with elevated BP with hx chronic HTN.  Pt c/o headaches, blurred vision, and dizziness.  Notified Thressa Sheller, CNM of pts elevated BP and sx.  Provider recommendation for pt to go to MAU for evaluation.  Notified pt of providers recommendation.  Pt agreed.

## 2017-08-09 NOTE — Progress Notes (Signed)
I have reviewed the chart and agree with nursing staff's documentation of this patient's encounter.  Thressa Sheller, CNM 08/09/2017 5:05 PM

## 2017-08-09 NOTE — Progress Notes (Signed)
Medicated with BP medication

## 2017-08-09 NOTE — MAU Note (Signed)
Pt stated she was downstairs for incision check and her b/p was elevated. Stated she had ben having headaches at night and has one now.

## 2017-08-09 NOTE — MAU Provider Note (Signed)
History     CSN: [id number]  Arrival date and time: 08/09/17 1404  Chief Complaint  Patient presents with  . Hypertension  . Headache   27 y.o. female 2 weeks postpartum sent from clinic for elevated BPs. Denies HA, visual disturbances, SOB, CP, and epigastric pain. Her pregnancy was complicated by [facility] not on meds.   OB History    Gravida  3   Para  2   Term  2   Preterm      AB  1   Living  2     SAB      TAB      Ectopic      Multiple  0   Live Births  2           Past Medical History:  Diagnosis Date  . Chlamydia   . Hypertension   . Vaginal Pap smear, abnormal     Past Surgical History:  Procedure Laterality Date  . CESAREAN SECTION N/A 12/09/2013   Procedure: CESAREAN SECTION;  Surgeon: Lesly Dukes, MD;  Location: [facility];  Service: Obstetrics;  Laterality: N/A;  . CESAREAN SECTION WITH BILATERAL TUBAL LIGATION Bilateral 07/23/2017   Procedure: REPEAT CESAREAN SECTION WITH BILATERAL TUBAL LIGATION;  Surgeon: Reva Bores, MD;  Location: [facility] BIRTHING SUITES;  Service: Obstetrics;  Laterality: Bilateral;    Family History  Problem Relation Age of Onset  . Hypertension Mother   . Hypertension Father   . Cancer Maternal Grandmother   . Diabetes Maternal Grandmother   . Cancer Maternal Grandfather   . Diabetes Paternal Grandmother   . Cancer Paternal Grandfather     Social History   Tobacco Use  . Smoking status: Never Smoker  . Smokeless tobacco: Never Used  Substance Use Topics  . Alcohol use: No    Comment: social-- before pregnant   . Drug use: No    Allergies: No Known Allergies  Medications Prior to Admission  Medication Sig Dispense Refill Last Dose  . acetaminophen (TYLENOL) 500 MG tablet Take 1,000 mg by mouth daily as needed for moderate pain or headache.    Past Month at Unknown time  . Prenatal Multivit-Min-Fe-FA (PRENATAL VITAMINS) 0.8 MG tablet Take 1 tablet by mouth daily. 30 tablet 12 08/09/2017 at Unknown time  .  pantoprazole (PROTONIX) 20 MG tablet TAKE 1 TABLET BY MOUTH EVERY DAY (Patient not taking: Reported on 08/09/2017) 30 tablet 1 Not Taking at Unknown time    Review of Systems  Eyes: Negative for visual disturbance.  Cardiovascular: Negative for leg swelling.  Gastrointestinal: Negative for abdominal pain.  Neurological: Negative for headaches.   Physical Exam   Blood pressure 134/81, pulse 96, temperature 98.5 F (36.9 C), resp. rate 16, height  (1.727 m), weight 250 lb (113.4 kg), unknown if currently breastfeeding. Patient Vitals for the past 24 hrs:  BP Temp Pulse Resp Height Weight  08/09/17 1731 134/81 - 96 16 - -  08/09/17 1716 132/90 - 90 16 - -  08/09/17 1701 136/86 - 91 16 - -  08/09/17 1646 138/81 - 96 16 - -  08/09/17 1631 137/87 - 94 16 - -  08/09/17 1614 (!) 149/100 - 80 16 - -  08/09/17 1545 (!) 162/92 - 80 18 - -  08/09/17 1530 (!) 156/111 - 74 18 - -  08/09/17 1515 (!) 177/97 - 67 18 - -  08/09/17 1500 (!) 169/86 - 68 18 - -  08/09/17 1458 (!) 158/101 -  64 18 - -  08/09/17 1420 (!) 148/75 98.5 F (36.9 C) 72 18  (1.727 m) 250 lb (113.4 kg)    Physical Exam  Constitutional: She is oriented to person, place, and time. She appears well-developed and well-nourished. No distress.  HENT:  Head: Normocephalic and atraumatic.  Neck: Normal range of motion.  Cardiovascular: Normal rate, regular rhythm and normal heart sounds.  Respiratory: Effort normal and breath sounds normal. No respiratory distress. She has no wheezes. She has no rales.  Musculoskeletal: Normal range of motion.  Neurological: She is alert and oriented to person, place, and time.  Skin: Skin is warm and dry.  Psychiatric: She has a normal mood and affect.   Results for orders placed or performed during the hospital encounter of 08/09/17 (from the past 24 hour(s))  Protein / creatinine ratio, urine     Status: None   Collection Time: 08/09/17  3:08 PM  Result Value Ref Range    Creatinine, Urine 78.00 mg/dL   Total Protein, Urine <6 mg/dL   Protein Creatinine Ratio        0.00 - 0.15 mg/mg[Cre]  CBC     Status: Abnormal   Collection Time: 08/09/17  3:24 PM  Result Value Ref Range   WBC 4.0 4.0 - 10.5 K/uL   RBC 4.70 3.87 - 5.11 MIL/uL   Hemoglobin 13.8 12.0 - 15.0 g/dL   HCT 91.4 78.2 - 95.6 %   MCV 91.9 78.0 - 100.0 fL   MCH 29.4 26.0 - 34.0 pg   MCHC 31.9 30.0 - 36.0 g/dL   RDW 21.3 08.6 - 57.8 %   Platelets 134 (L) 150 - 400 K/uL  Comprehensive metabolic panel     Status: None   Collection Time: 08/09/17  3:24 PM  Result Value Ref Range   Sodium 137 135 - 145 mmol/L   Potassium 4.3 3.5 - 5.1 mmol/L   Chloride 103 101 - 111 mmol/L   CO2 27 22 - 32 mmol/L   Glucose, Bld 91 65 - 99 mg/dL   BUN 11 6 - 20 mg/dL   Creatinine, Ser 4.69 0.44 - 1.00 mg/dL   Calcium 9.0 8.9 - 62.9 mg/dL   Total Protein 6.9 6.5 - 8.1 g/dL   Albumin 3.6 3.5 - 5.0 g/dL   AST 19 15 - 41 U/L   ALT 17 14 - 54 U/L   Alkaline Phosphatase 75 38 - 126 U/L   Total Bilirubin 0.4 0.3 - 1.2 mg/dL   GFR calc non Af Amer >60 >60 mL/min   GFR calc Af Amer >60 >60 mL/min   Anion gap 7 5 - 15    MAU Course  Procedures Procardia IR   MDM Labs ordered and reviewed. No evidence of si pre-e. BPs improved after Procardia. Will send home with Procardia XL and close f/u. Stable for discharge home.  Assessment and Plan   1. Chronic hypertension   2. Postpartum state    Discharge home Follow up in [facility] in 2 days for BP check-message sent Pre-e/return precautions Rx Procardia  Allergies as of 08/09/2017   No Known Allergies     Medication List    STOP taking these medications   pantoprazole 20 MG tablet Commonly known as:  PROTONIX     TAKE these medications   acetaminophen 500 MG tablet Commonly known as:  TYLENOL Take 1,000 mg by mouth daily as needed for moderate pain or headache.   NIFEdipine 30 MG 24 hr  tablet Commonly known as:   PROCARDIA-XL/ADALAT-CC/NIFEDICAL-XL Take 1 tablet (30 mg total) by mouth daily.   Prenatal Vitamins 0.8 MG tablet Take 1 tablet by mouth daily.      Donette Larry, CNM 08/09/2017, 5:42 PM

## 2017-08-09 NOTE — Progress Notes (Signed)
Reviewed D.C instructions with pt.  Go to clinic on 5/8 for BP check.

## 2017-08-11 ENCOUNTER — Ambulatory Visit: Payer: Medicaid Other

## 2017-08-31 ENCOUNTER — Ambulatory Visit: Payer: Medicaid Other | Admitting: Obstetrics and Gynecology

## 2017-09-07 ENCOUNTER — Other Ambulatory Visit (HOSPITAL_COMMUNITY)
Admission: RE | Admit: 2017-09-07 | Discharge: 2017-09-07 | Disposition: A | Discharge status: Home / Self Care | Payer: Medicaid Other | Source: Ambulatory Visit | Attending: Obstetrics and Gynecology | Admitting: Obstetrics and Gynecology

## 2017-09-07 ENCOUNTER — Ambulatory Visit (INDEPENDENT_AMBULATORY_CARE_PROVIDER_SITE_OTHER): Payer: Medicaid Other | Admitting: Obstetrics and Gynecology

## 2017-09-07 DIAGNOSIS — R87619 Unspecified abnormal cytological findings in specimens from cervix uteri: Secondary | ICD-10-CM

## 2017-09-07 DIAGNOSIS — Z1389 Encounter for screening for other disorder: Secondary | ICD-10-CM | POA: Diagnosis not present

## 2017-09-07 DIAGNOSIS — O10919 Unspecified pre-existing hypertension complicating pregnancy, unspecified trimester: Secondary | ICD-10-CM

## 2017-09-07 LAB — POCT PREGNANCY, URINE: Preg Test, Ur: NEGATIVE

## 2017-09-07 NOTE — Patient Instructions (Addendum)
Follow-up in one year for a pap smear. Find a PCP to manage your medical conditions outside of pregnnacy  Things to do to Keep yourself Healthy - Exercise at least 30-45 minutes a day,  3-4 days a week.  - Eat a low-fat diet with lots of fruits and vegetables, up to 7-9 servings per day. - Seatbelts can save your life. Wear them always. - Smoke detectors on every level of your home, check batteries every year. - Eye Doctor - have an eye exam every 1-2 years - Safe sex - if you may be exposed to STDs, use a condom. - Alcohol If you drink, do it moderately,less than 2 drinks per day. - Health Care Power of Attorney.  Choose someone to speak for you if you are not able. - Depression is common in our stressful world.If you're feeling down or losing interest in things you normally enjoy, please come in for a visit. - Violence - If anyone is threatening or hurting you, please call immediately.

## 2017-09-07 NOTE — Progress Notes (Signed)
Subjective:     Santa GeneraHeather L Castilleja is a 27 y.o. female who presents for a postpartum visit. She is 7 weeks postpartum following a low cervical transverse Cesarean section with postpartum BTL. I have fully reviewed the prenatal and intrapartum course. The delivery was at 39.2 gestational weeks. Outcome: repeat cesarean section, low transverse incision. Anesthesia: spinal. Postpartum course has been uncomplicated. Did go to the MAU once postpartum due to elevated BPs and headache. Started on Procardia. History of chronic HTN. PIH labs normal at that time. Baby's course has been uncomplicated. Baby is feeding by both breast and bottle - Lucien MonsGerber Good Start. Bleeding no bleeding. Bowel function is normal. Bladder function is normal. Patient is sexually active. Contraception method is tubal ligation. Postpartum depression screening: negative.  The following portions of the patient's history were reviewed and updated as appropriate: allergies, current medications, past family history, past medical history, past social history, past surgical history and problem list.  Review of Systems Pertinent items are noted in HPI.   Objective:    BP 127/81   Pulse 97   LMP 08/29/2017 (Approximate)   General:  alert, cooperative and no distress   Breasts:  negative  Lungs: normal effort and rate  Heart:  regular rate and rhythm  Abdomen: soft, non-tender; bowel sounds normal; no masses,  no organomegaly. Incision well-healed   Vulva:  normal  Vagina: normal vagina, no discharge, exudate, lesion, or erythema  Cervix:  anteverted, no cervical motion tenderness and no lesions  Corpus: normal  Adnexa:  normal adnexa and no mass, fullness, tenderness  Psych: normal mood and affect        Assessment:    Normal postpartum exam. Colposcopy done at today's visit.   Plan:    1. Contraception: tubal ligation 2. Abnormal pap smear: Please see separate Colposcopy note 3. CHTN: BPs stable today on procardia. Patient  encouraged to follow-up with PCP for long term management of HTN. 3. Follow up in: 1 year or as needed.    Caryl AdaJazma Phelps, DO OB Fellow [facility]

## 2017-09-08 ENCOUNTER — Encounter: Payer: Self-pay | Admitting: *Deleted

## 2017-09-09 ENCOUNTER — Encounter: Payer: Self-pay | Admitting: Obstetrics and Gynecology

## 2017-09-09 NOTE — Progress Notes (Signed)
    COLPOSCOPY PROCEDURE NOTE  27 y.o. [id number] here for colposcopy for low-grade squamous intraepithelial neoplasia (LGSIL - encompassing HPV,mild dysplasia,CIN I) pap smear on 12/21/16. Discussed role for HPV in cervical dysplasia, need for surveillance.  Patient given informed consent, signed copy in the chart, time out was performed.  Placed in lithotomy position. Cervix viewed with speculum and colposcope after application of acetic acid. Lugol's solution also applied.    Colposcopy adequate? Yes no visible lesions, no mosaicism, no punctation and no abnormal vasculature No specimens obtained. All specimens were labeled and sent to pathology.  Biopsies? No ECC? No, but endocervical brush used Complications? None   Patient was given post procedure instructions.  Will follow up pathology and manage accordingly; patient will be contacted with results and recommendations.  Routine preventative health maintenance measures emphasized.   Caryl AdaJazma Phelps, DO OB Fellow [facility], [facility] [place]

## 2017-09-16 ENCOUNTER — Other Ambulatory Visit: Payer: Self-pay | Admitting: Certified Nurse Midwife

## 2018-07-10 ENCOUNTER — Telehealth: Payer: Medicaid Other | Admitting: Physician Assistant

## 2018-07-10 DIAGNOSIS — R059 Cough, unspecified: Secondary | ICD-10-CM

## 2018-07-10 DIAGNOSIS — R05 Cough: Secondary | ICD-10-CM

## 2018-07-10 MED ORDER — BENZONATATE 200 MG PO CAPS: 200.0000 mg | ORAL_CAPSULE | Freq: Two times a day (BID) | ORAL | 0 refills | Status: DC | PRN

## 2018-07-10 MED ORDER — PROMETHAZINE-DM 6.25-15 MG/5ML PO SYRP: 5.0000 mL | ORAL_SOLUTION | Freq: Four times a day (QID) | ORAL | 0 refills | Status: DC | PRN

## 2018-07-10 NOTE — Progress Notes (Signed)
E-Visit for Corona Virus Screening  Based on your current symptoms, you may very well have the virus, however your symptoms are mild. Currently, not all patients are being tested. If the symptoms are mild and there is not a known exposure, performing the test is not indicated.  Coronavirus disease 2019 (COVID-19) is a respiratory illness that can spread from person to person. The virus that causes COVID-19 is a new virus that was first identified in the country of [place] but is now found in multiple other countries and has spread to the [place].  Symptoms associated with the virus are mild to severe fever, cough, and shortness of breath. There is currently no vaccine to protect against COVID-19, and there is no specific antiviral treatment for the virus.   To be considered HIGH RISK for Coronavirus (COVID-19), you have to meet the following criteria:  . Traveled to [place], [place], [place], [place] or [place]; or in the [place] to [place], [place], [place], or [place]; and have fever, cough, and shortness of breath within the last 2 weeks of travel OR  . Been in close contact with a person diagnosed with COVID-19 within the last 2 weeks and have fever, cough, and shortness of breath  . IF YOU DO NOT MEET THESE CRITERIA, YOU ARE CONSIDERED LOW RISK FOR COVID-19.   It is vitally important that if you feel that you have an infection such as this virus or any other virus that you stay home and away from places where you may spread it to others.  You should self-quarantine for 14 days if you have symptoms that could potentially be coronavirus and avoid contact with people age 30 and older.   You can use medication such as A prescription cough medication called Tessalon Perles 100 mg. You may take 1-2 capsules every 8 hours as needed for cough and A prescription cough medication called Phenergan DM 6.25 mg/15 mg. You make take one teaspoon / 5 ml every 4-6 hours as needed for cough  You  may also take acetaminophen (Tylenol) as needed for fever.   Reduce your risk of any infection by using the same precautions used for avoiding the common cold or flu:  Marland Kitchen Wash your hands often with soap and warm water for at least 20 seconds.  If soap and water are not readily available, use an alcohol-based hand sanitizer with at least 60% alcohol.  . If coughing or sneezing, cover your mouth and nose by coughing or sneezing into the elbow areas of your shirt or coat, into a tissue or into your sleeve (not your hands). . Avoid shaking hands with others and consider head nods or verbal greetings only. . Avoid touching your eyes, nose, or mouth with unwashed hands.  . Avoid close contact with people who are sick. . Avoid places or events with large numbers of people in one location, like concerts or sporting events. . Carefully consider travel plans you have or are making. . If you are planning any travel outside or inside the [place], visit the CDC's Travelers' Health webpage for the latest health notices. . If you have some symptoms but not all symptoms, continue to monitor at home and seek medical attention if your symptoms worsen. . If you are having a medical emergency, call 911.  HOME CARE . Only take medications as instructed by your medical team. . Drink plenty of fluids and get plenty of rest. . A steam or ultrasonic humidifier can  help if you have congestion.   GET HELP RIGHT AWAY IF: . You develop worsening fever. . You become short of breath . You cough up blood. . Your symptoms become more severe MAKE SURE YOU   Understand these instructions.  Will watch your condition.  Will get help right away if you are not doing well or get worse.  Your e-visit answers were reviewed by a board certified advanced clinical practitioner to complete your personal care plan.  Depending on the condition, your plan could have included both over the counter or prescription medications.  If there is  a problem please reply once you have received a response from your provider. Your safety is important to us.  If you have drug allergies check your prescription carefully.    You can use MyChart to ask questions about today's visit, request a non-urgent call back, or ask for a work or school excuse for 24 hours related to this e-Visit. If it has been greater than 24 hours you will need to follow up with your provider, or enter a new e-Visit to address those concerns. You will get an e-mail in the next two days asking about your experience.  I hope that your e-visit has been valuable and will speed your recovery. Thank you for using e-visits.    

## 2018-07-11 ENCOUNTER — Encounter: Payer: Self-pay | Admitting: Family

## 2018-11-04 IMAGING — US US FETAL BPP W/ NON-STRESS
1 series · 13 of 13 positions shown · non-contrast
Comparison: none

[Series 1: us fetal bpp w/nonstress · 13 acquisitions, 13 frames shown]
[im 1/13]
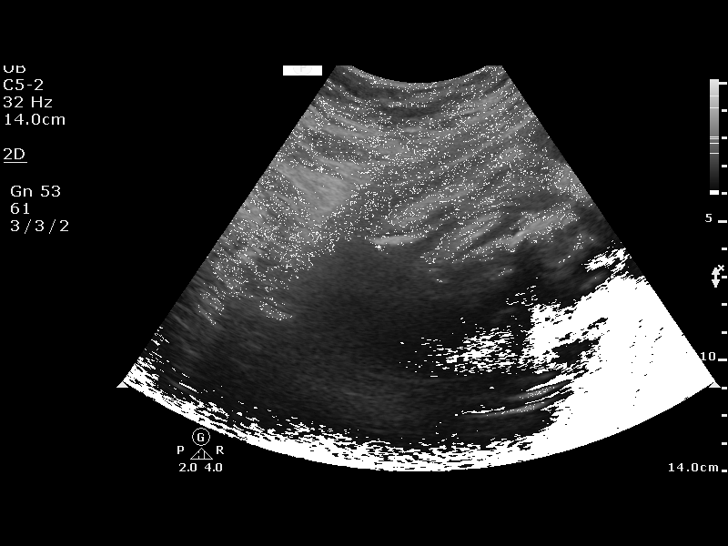
[im 2/13]
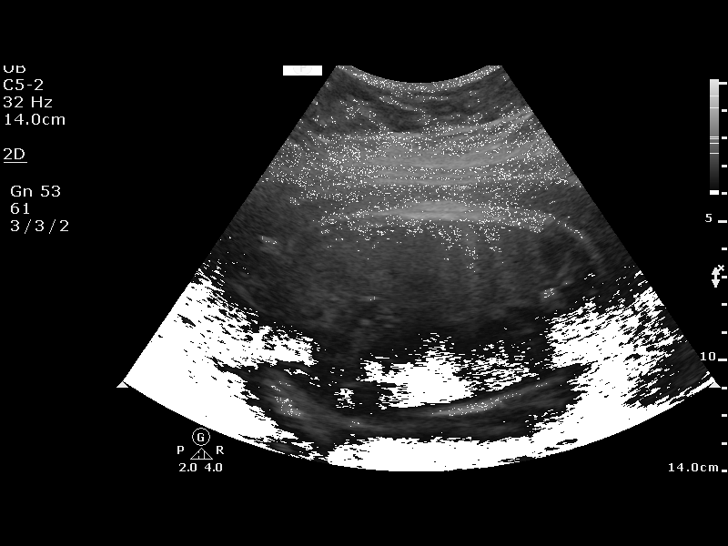
[im 3/13]
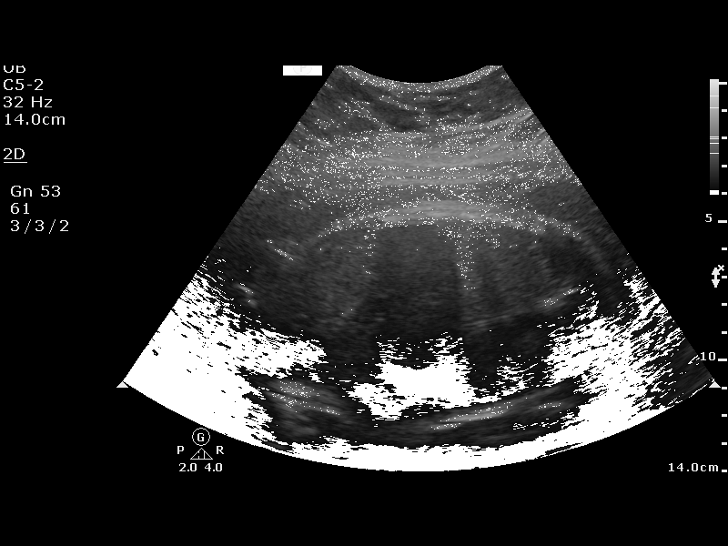
[im 4/13]
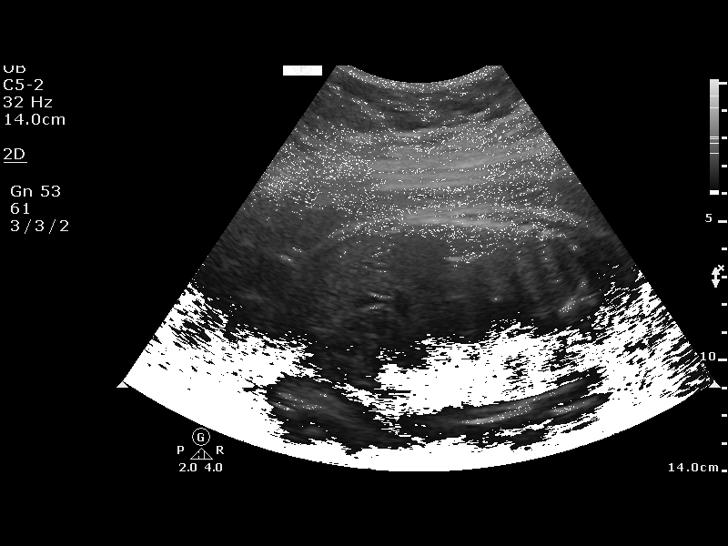
[im 5/13]
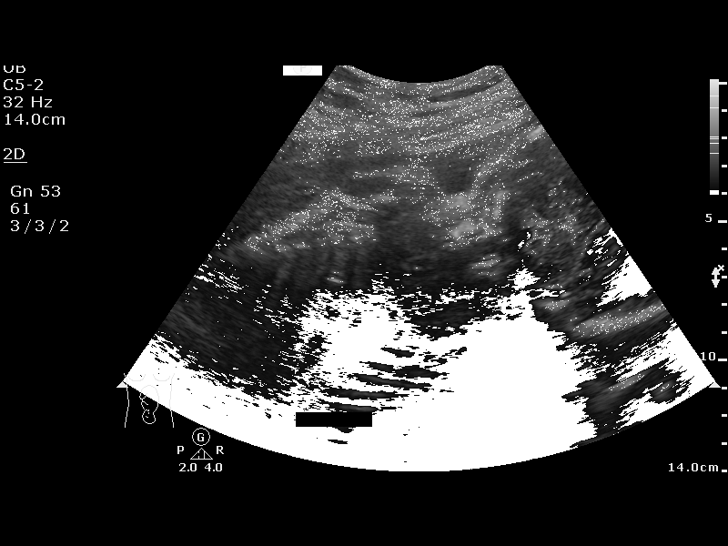
[im 6/13]
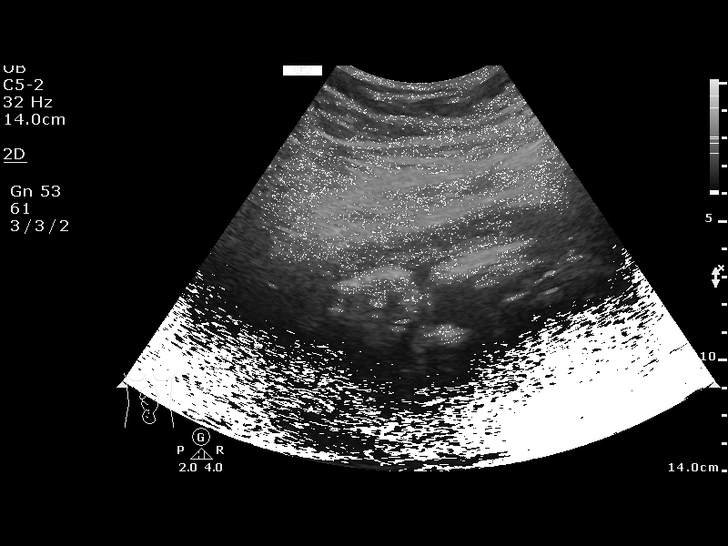
[im 7/13]
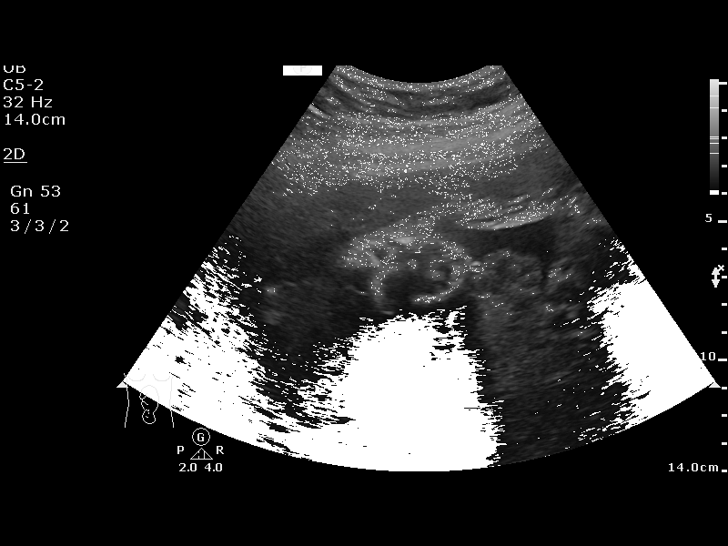
[im 8/13]
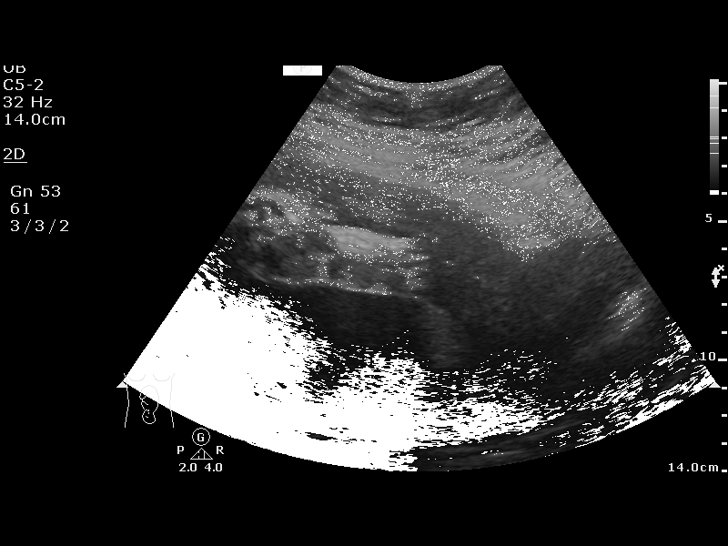
[im 9/13]
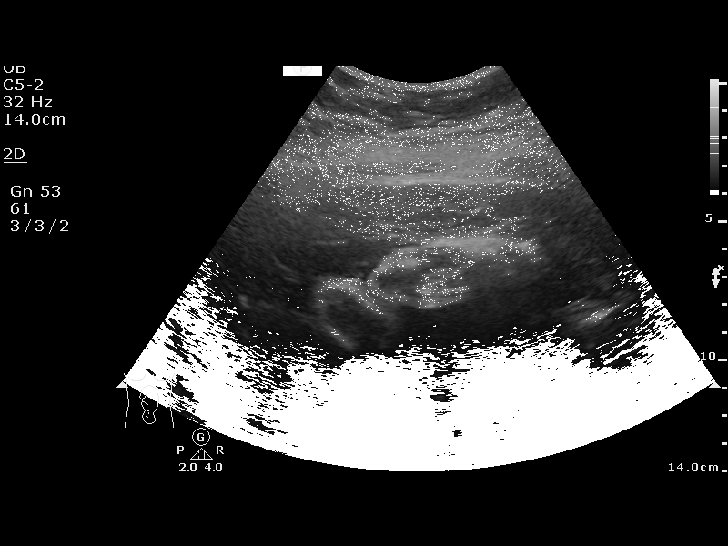
[im 10/13]
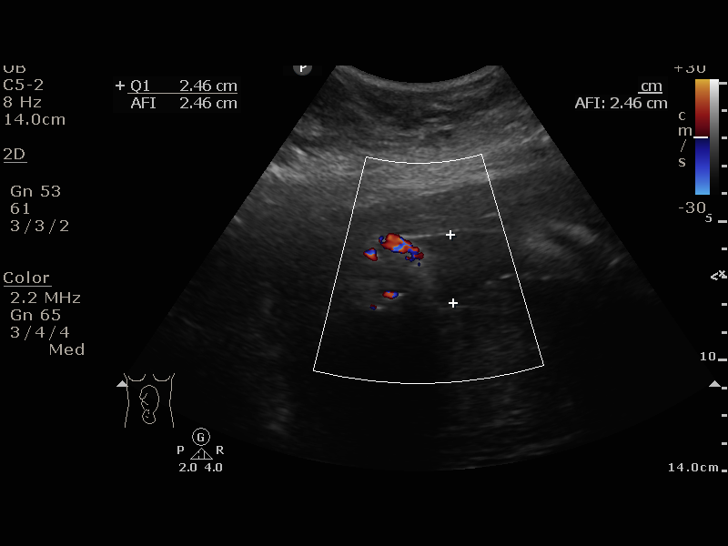
[im 11/13]
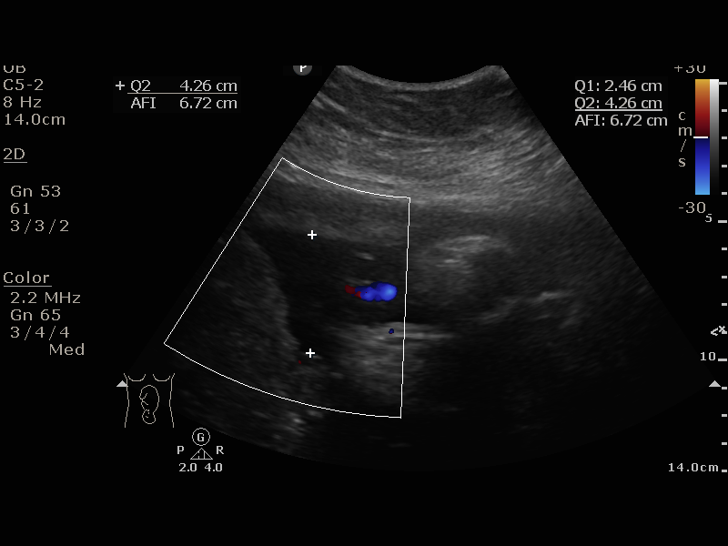
[im 12/13]
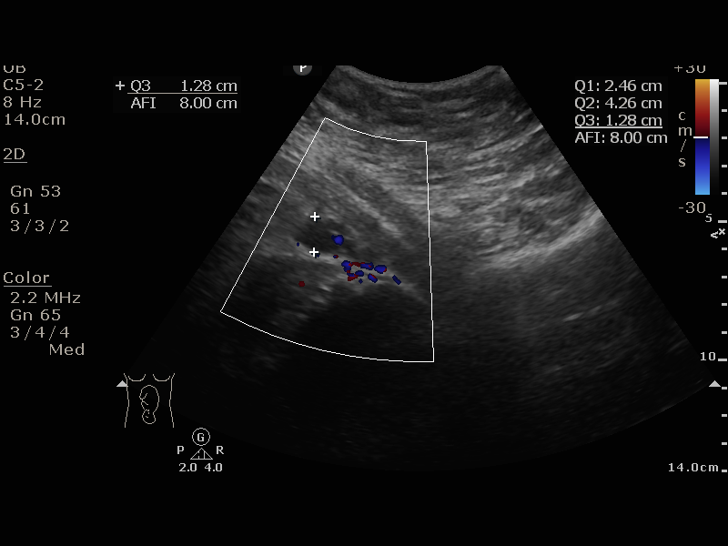
[im 13/13]
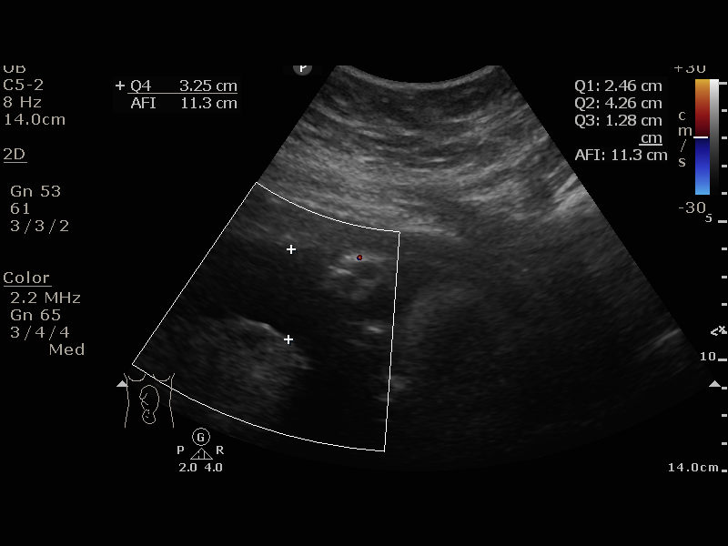

[13 of 13 positions shown; findings below may reference images not displayed]

BSN
MAU/Triage
[REDACTED]

1  US FETAL BPP W/NONSTRESS                    76818.4

1  AQUILES OSORNIO             [id number]      [id number]     [id number]
Indications

33 weeks gestation of pregnancy
Unspecified pre-existing hypertension
complicating pregnancy, third trimester
OB History

Blood Type:            Height:  5'8"   Weight (lb):  249       BMI:
Gravidity:    3         Term:   1
TOP:          1        Living:  1
Fetal Evaluation

Num Of Fetuses:     1
Preg. Location:     Intrauterine
Cardiac Activity:   Observed
Fetal Lie:          Maternal left side
Presentation:       Cephalic

Amniotic Fluid
AFI FV:      Subjectively within normal limits

AFI Sum(cm)     %Tile       Largest Pocket(cm)
11.25           28

RUQ(cm)       RLQ(cm)       LUQ(cm)        LLQ(cm)
2.46
Biophysical Evaluation
Amniotic F.V:   Pocket => 2 cm two         F. Tone:        Observed
planes
F. Movement:    Observed                   N.S.T:          Reactive
F. Breathing:   Observed                   Score:          [DATE]
Gestational Age

LMP:           33w 5d        Date:  10/21/16                 EDD:   07/28/17
Best:          33w 5d     Det. By:  LMP  (10/21/16)          EDD:   07/28/17
Impression

IUP at  33.5 weeks
Normal amniotic fluid volume
BPP [DATE] with reactive NST
Recommendations

Continue antenatal testing.
Recommend delivery by 39 weeks if maternal and fetal status
remain reassuring.

## 2018-12-04 IMAGING — US US FETAL BPP W/ NON-STRESS
1 series · 13 of 14 positions shown · non-contrast
Comparison: none

[Series 1: us fetal bpp w/nonstress · 14 acquisitions, 13 frames shown]
[im 1/14]
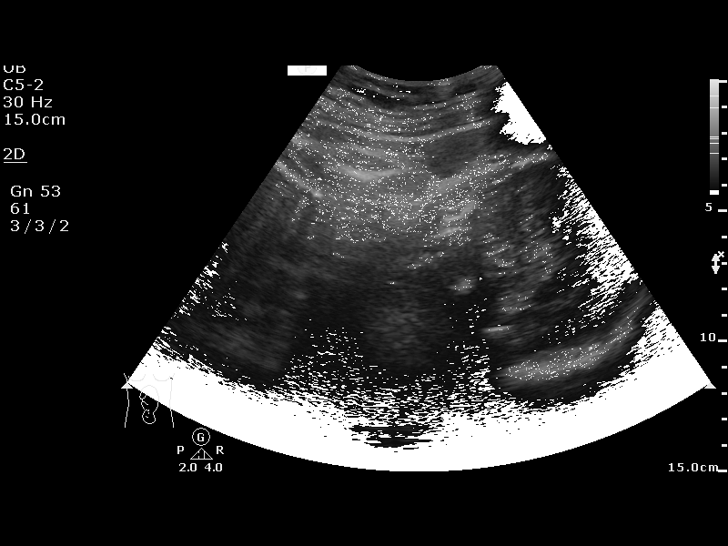
[im 2/14]
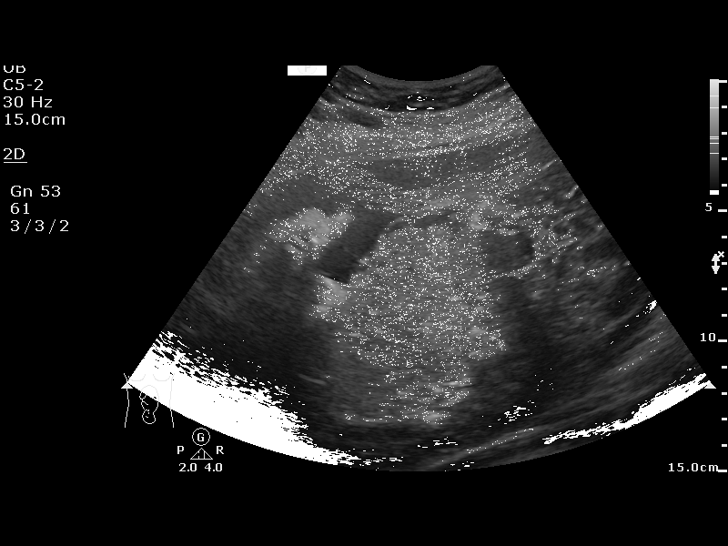
[im 3/14]
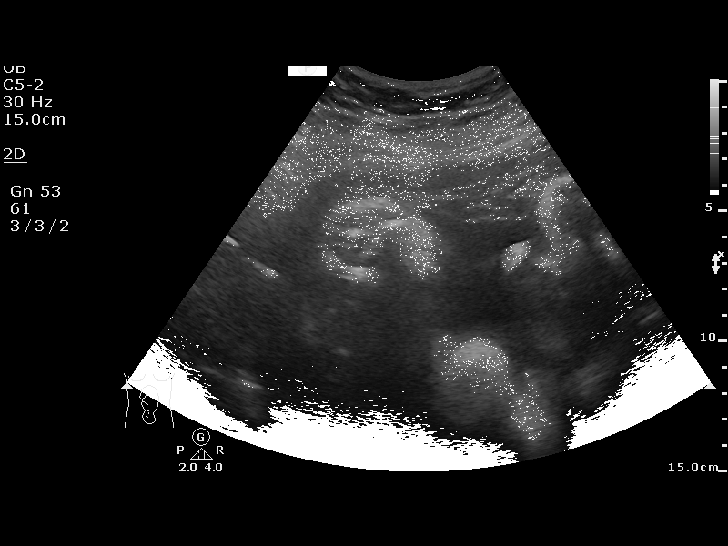
[im 4/14]
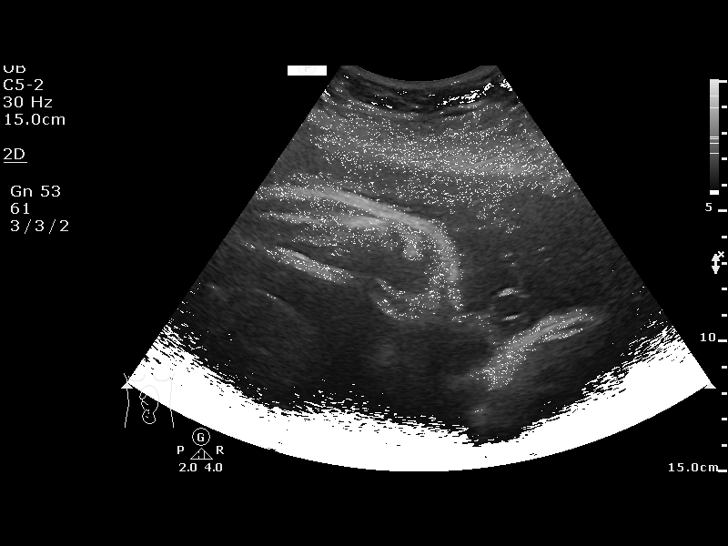
[im 5/14]
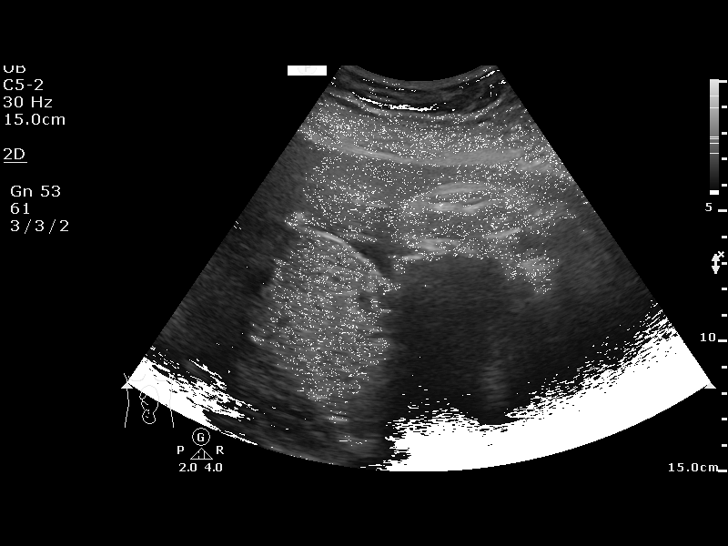
[im 6/14]
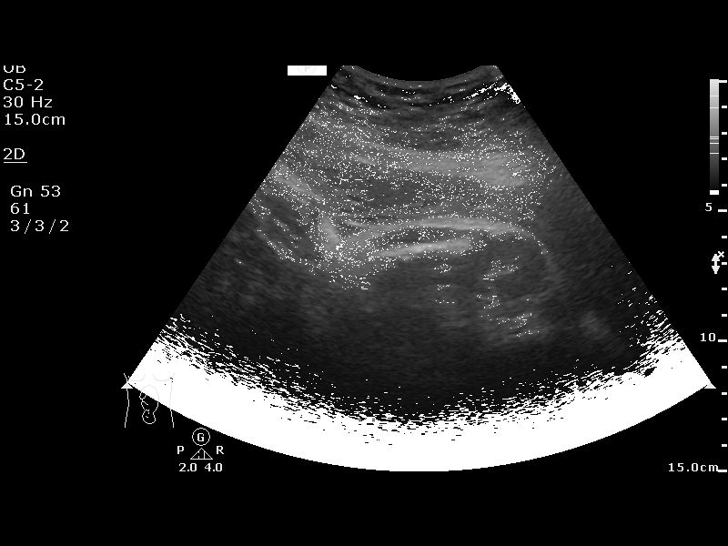
[im 8/14]
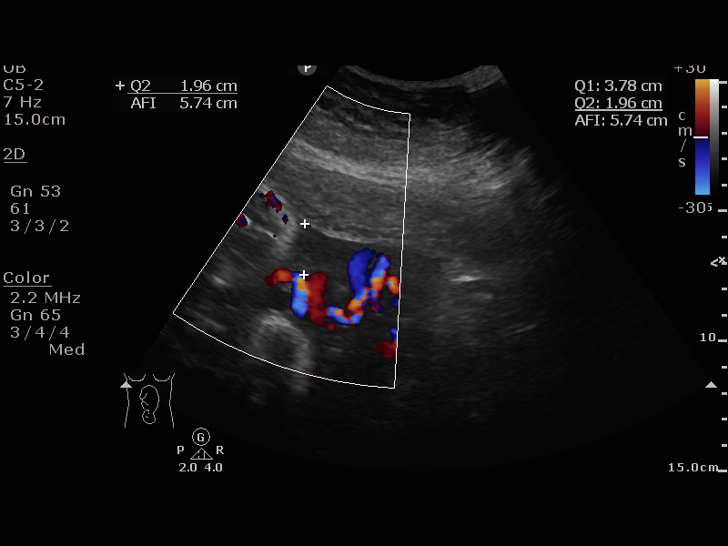
[im 9/14]
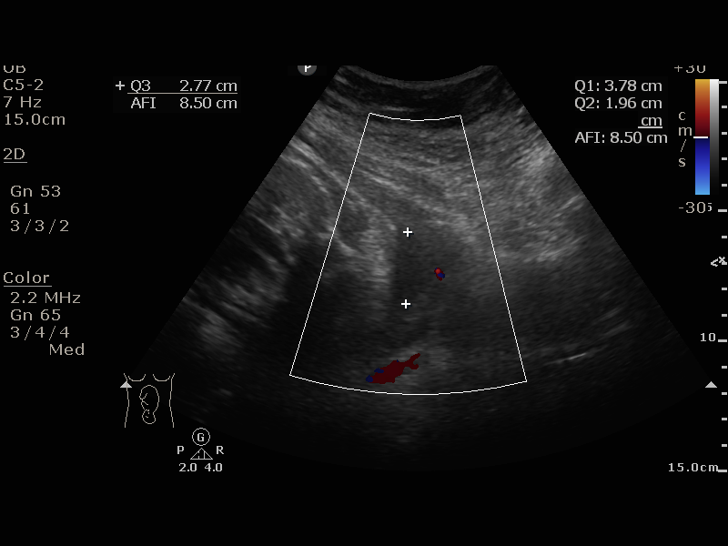
[im 10/14]
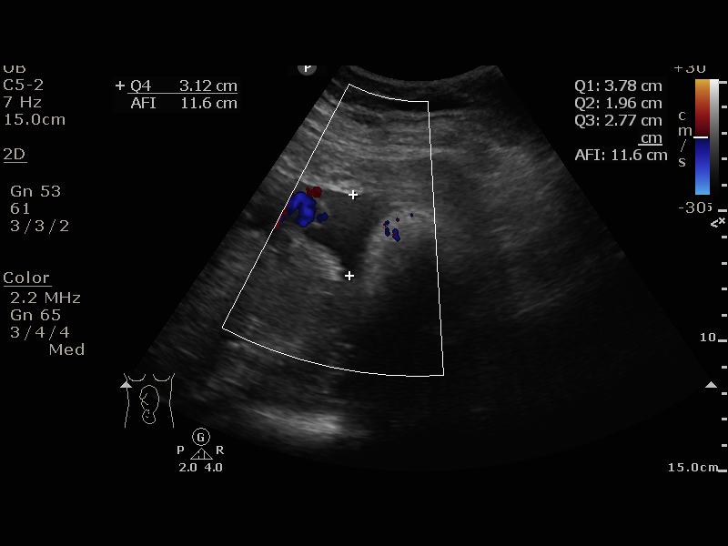
[im 11/14]
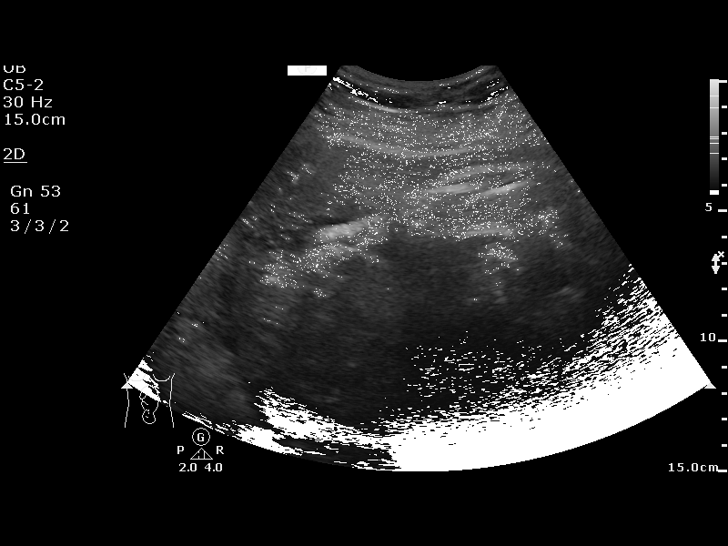
[im 12/14]
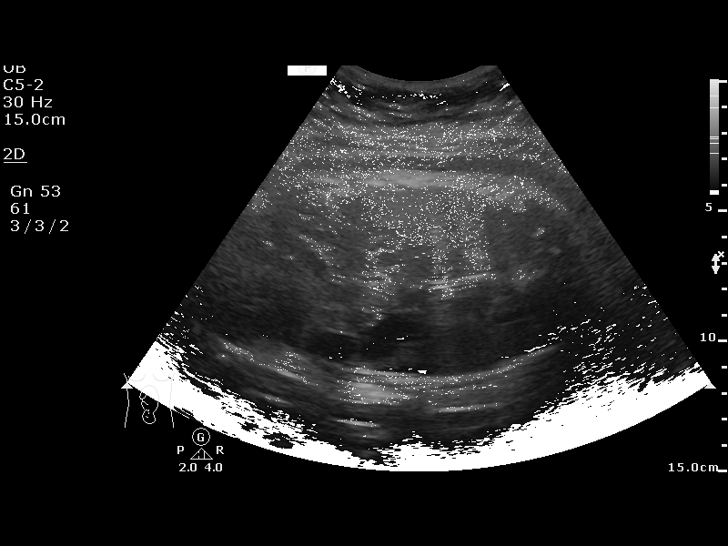
[im 13/14]
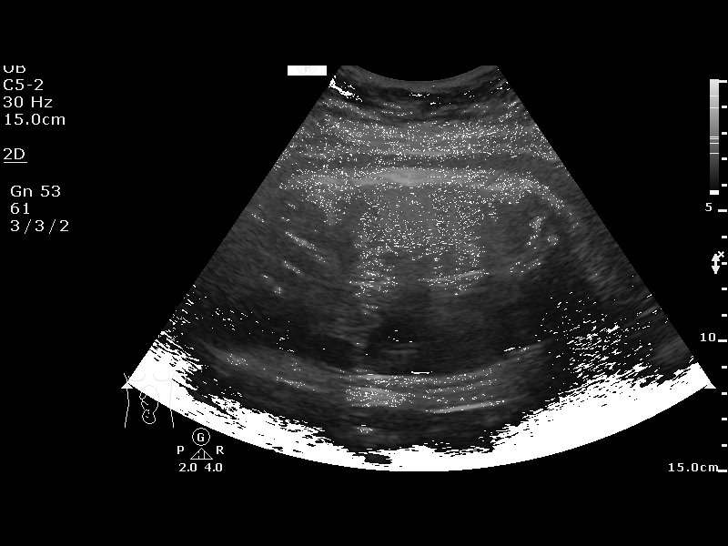
[im 14/14]
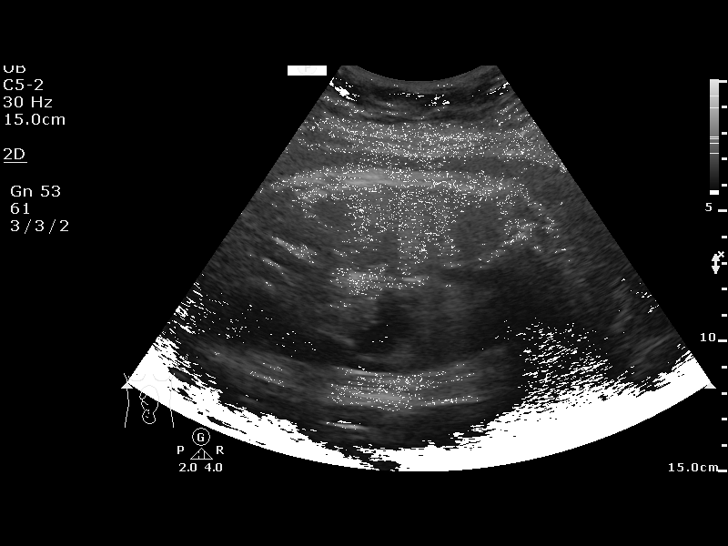

[13 of 14 positions shown; findings below may reference images not displayed]

Attending:        Wilner Petersen           Secondary Phy.:   [facility]-
MAU/Triage
[REDACTED]

1  US FETAL BPP W/NONSTRESS                    76818.4

1  DON LOLITO ONACRAM                [id number]      [id number]     [id number]
Service(s) Provided

Indications

38 weeks gestation of pregnancy
Unspecified pre-existing hypertension
complicating pregnancy, third trimester
OB History

Blood Type:            Height:  5'8"   Weight (lb):  249      BMI:
Gravidity:    3         Term:   1
TOP:          1        Living:  1
Fetal Evaluation

Num Of Fetuses:     1
Preg. Location:     Intrauterine
Cardiac Activity:   Observed
Presentation:       Cephalic

Amniotic Fluid
AFI FV:      Subjectively within normal limits

AFI Sum(cm)     %Tile       Largest Pocket(cm)
11.63           38

RUQ(cm)       RLQ(cm)       LUQ(cm)        LLQ(cm)
3.78
Biophysical Evaluation

Amniotic F.V:   Pocket => 2 cm two         F. Tone:        Observed
planes
F. Movement:    Observed                   N.S.T:          Reactive
F. Breathing:   Observed                   Score:          [DATE]
Gestational Age

LMP:           38w 0d       Date:   10/21/16                 EDD:   07/28/17
Best:          38w 0d    Det. By:   LMP  (10/21/16)          EDD:   07/28/17
Impression

Reassuring testing
Chronic HTN
Recommendations

Continue weekly BPP until delivery

## 2018-12-11 IMAGING — US US FETAL BPP W/ NON-STRESS
1 series · 13 of 14 positions shown · non-contrast
Comparison: none

[Series 1: us fetal bpp w/nonstress · 14 acquisitions, 13 frames shown]
[im 1/14]
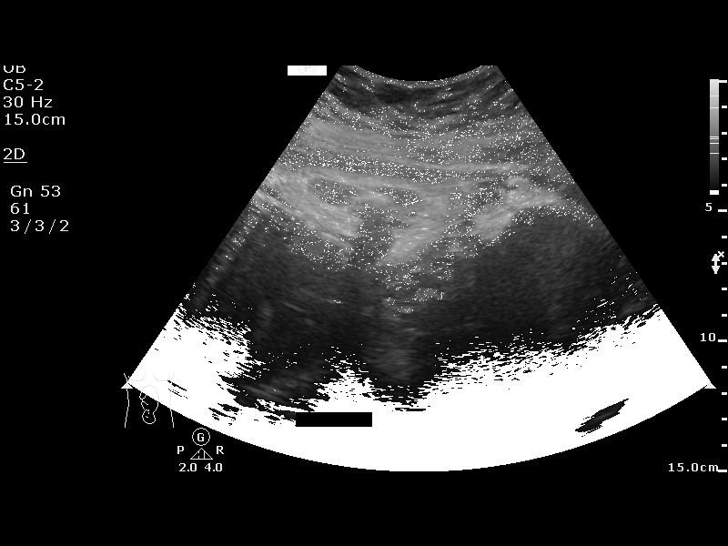
[im 2/14]
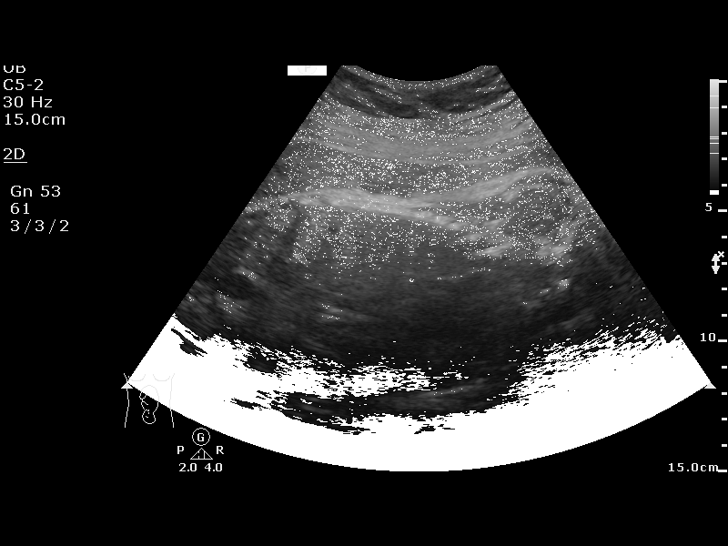
[im 3/14]
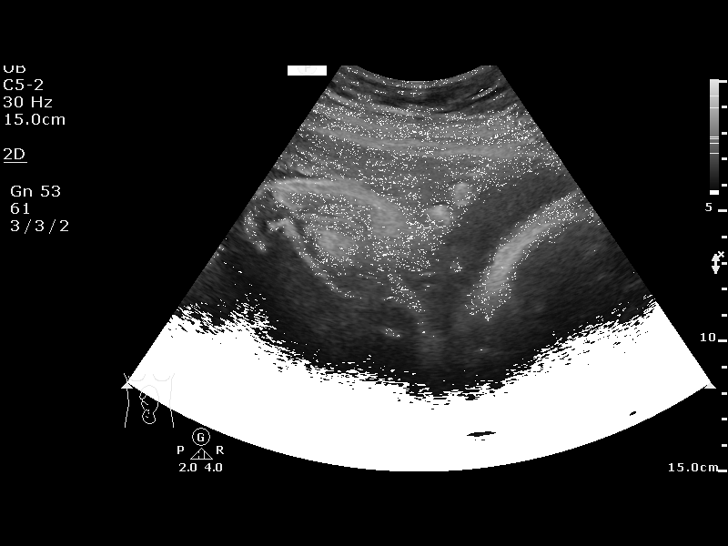
[im 4/14]
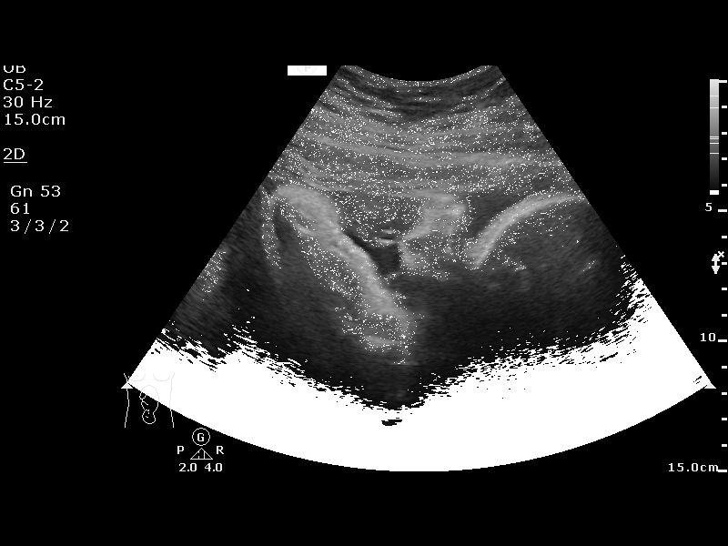
[im 5/14]
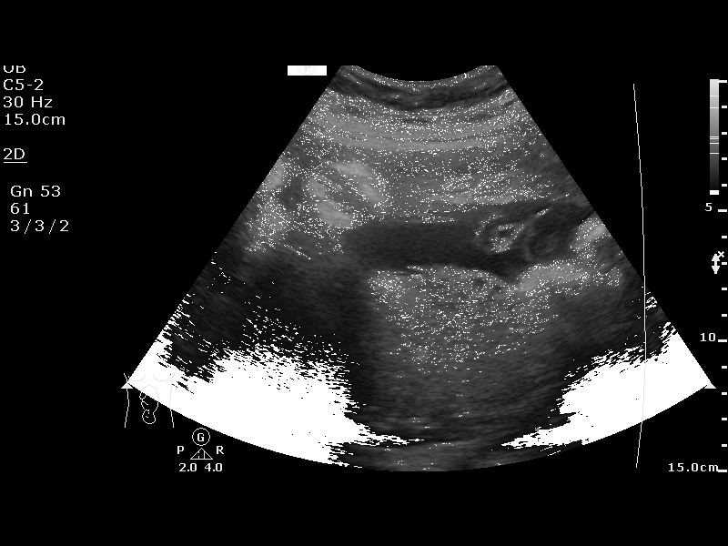
[im 6/14]
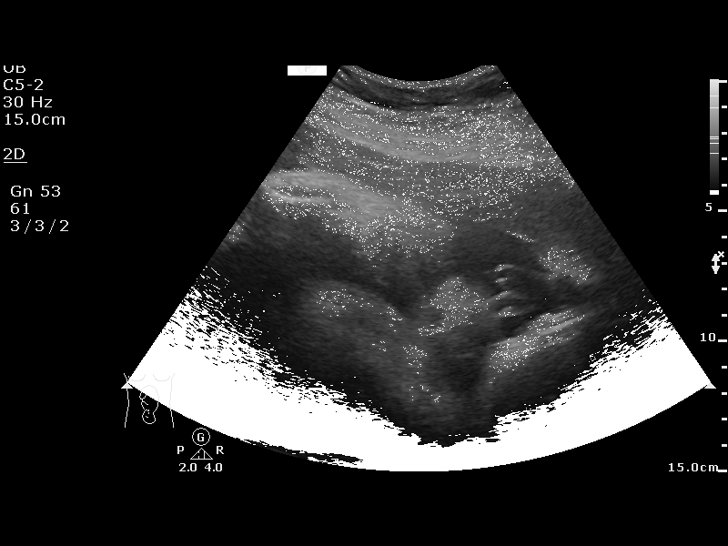
[im 8/14]
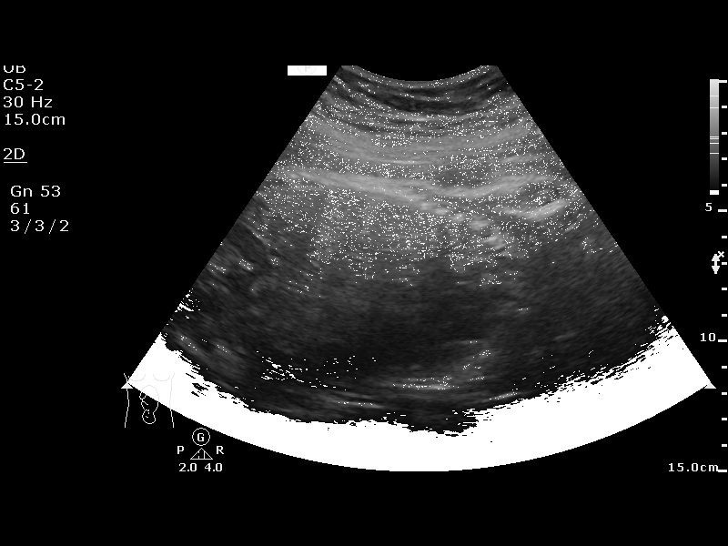
[im 9/14]
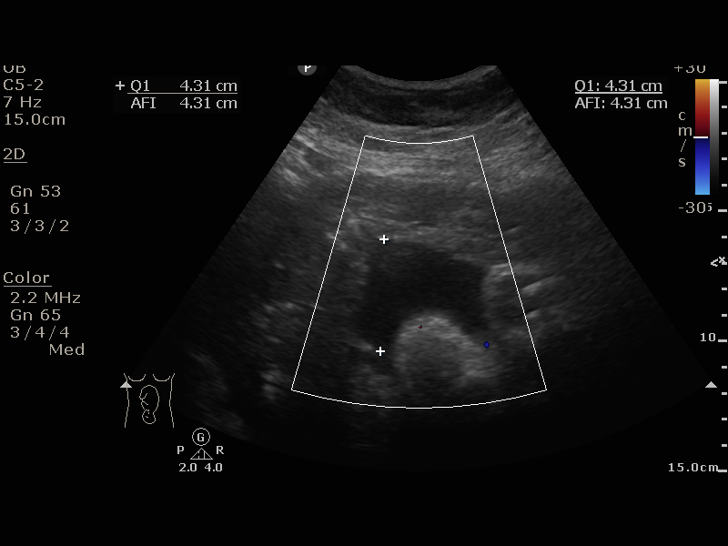
[im 10/14]
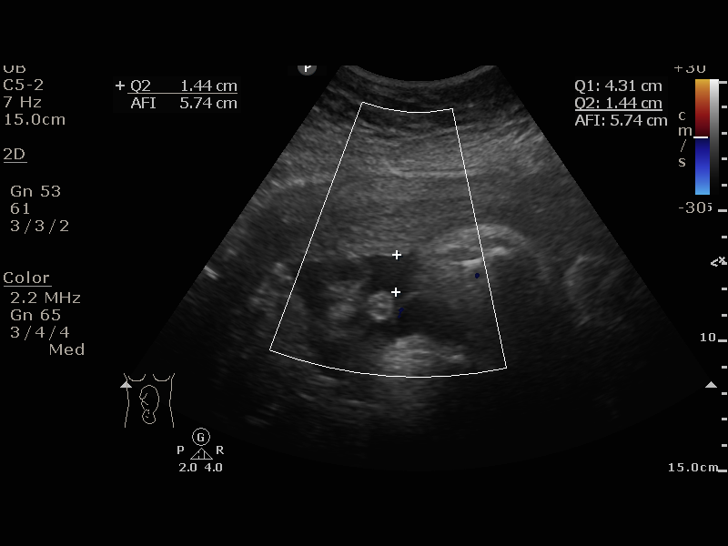
[im 11/14]
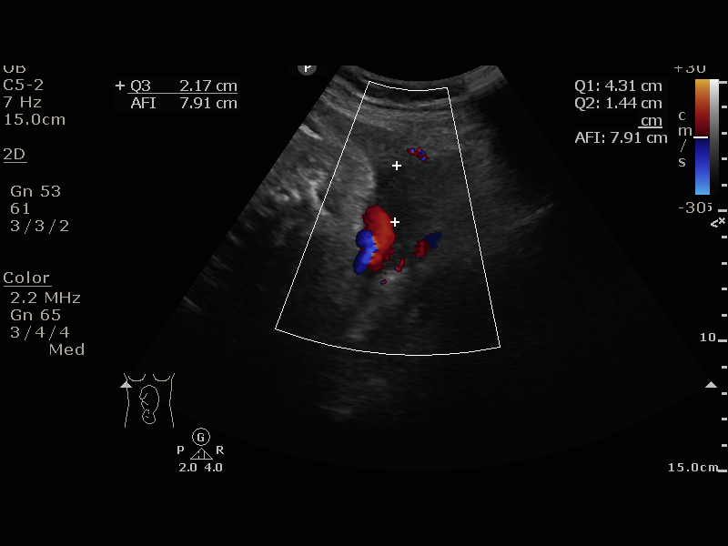
[im 12/14]
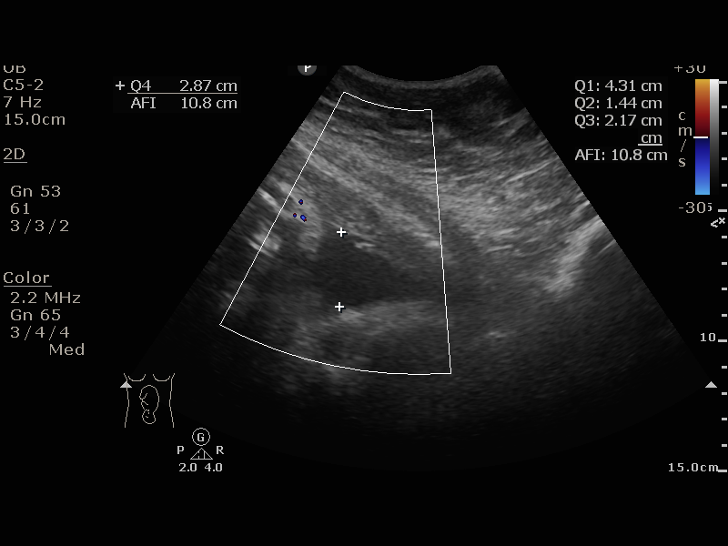
[im 13/14]
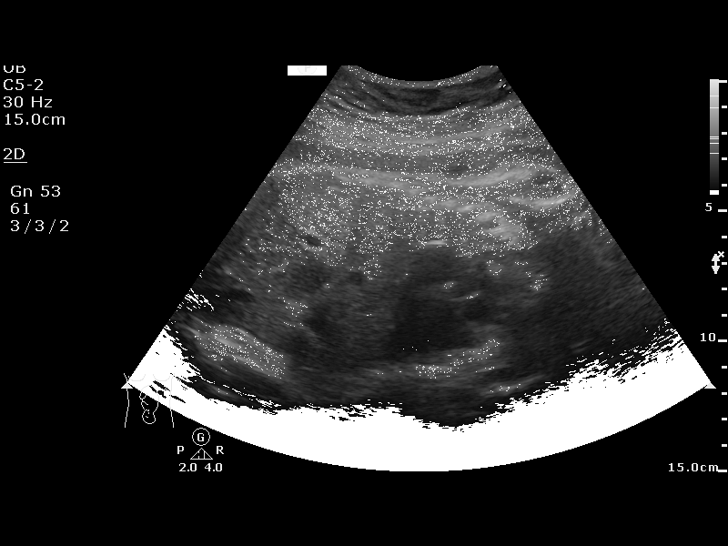
[im 14/14]
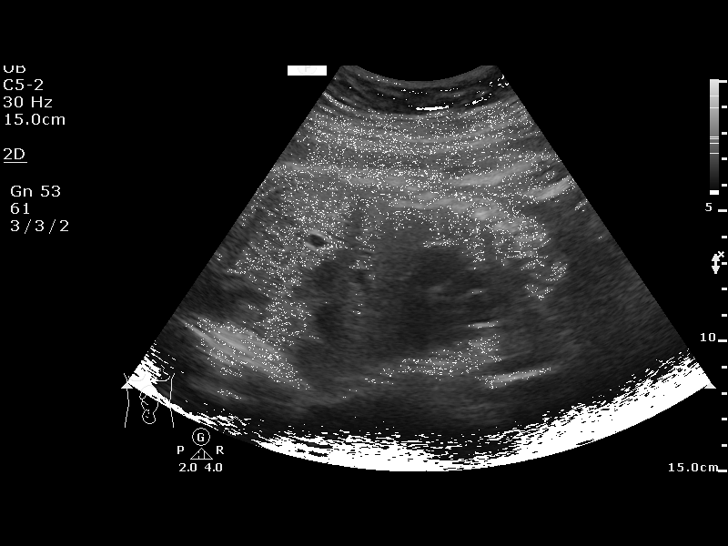

[13 of 14 positions shown; findings below may reference images not displayed]

MAU/Triage
[REDACTED]

1  US FETAL BPP W/NONSTRESS                    76818.4

1  MOTIVACIJSKE ANTEA              [id number]      [id number]     [id number]
Service(s) Provided

Indications

39 weeks gestation of pregnancy
Unspecified pre-existing hypertension
complicating pregnancy, third trimester
OB History

Blood Type:            Height:  5'8"   Weight (lb):  249      BMI:
Gravidity:    3         Term:   1
TOP:          1        Living:  1
Fetal Evaluation

Num Of Fetuses:     1
Preg. Location:     Intrauterine
Cardiac Activity:   Observed
Presentation:       Cephalic

Amniotic Fluid
AFI FV:      Subjectively within normal limits

AFI Sum(cm)     %Tile       Largest Pocket(cm)
10.79           34

RUQ(cm)       RLQ(cm)       LUQ(cm)        LLQ(cm)
4.31

Comment:    Breathing noted intermittently throughout BPP, but not sustained.
Biophysical Evaluation

Amniotic F.V:   Pocket => 2 cm two         F. Tone:        Observed
planes
F. Movement:    Observed                   N.S.T:          Reactive
F. Breathing:   Not Observed               Score:          [DATE]
Gestational Age

LMP:           39w 0d       Date:   10/21/16                 EDD:   07/28/17
Best:          39w 0d    Det. By:   LMP  (10/21/16)          EDD:   07/28/17
Impression

BPP [DATE] due to intermittent breathing.
Recommendations

Cont weekly testing, scheduled for RCS @ 39 weeks.

## 2019-05-07 ENCOUNTER — Telehealth: Payer: Medicaid Other | Admitting: Nurse Practitioner

## 2019-05-07 DIAGNOSIS — Z20822 Contact with and (suspected) exposure to covid-19: Secondary | ICD-10-CM

## 2019-05-07 DIAGNOSIS — R05 Cough: Secondary | ICD-10-CM

## 2019-05-07 DIAGNOSIS — R059 Cough, unspecified: Secondary | ICD-10-CM

## 2019-05-07 MED ORDER — PROMETHAZINE-DM 6.25-15 MG/5ML PO SYRP: 5.0000 mL | ORAL_SOLUTION | Freq: Four times a day (QID) | ORAL | 0 refills | Status: AC | PRN

## 2019-05-07 NOTE — Progress Notes (Signed)
E-Visit for Corona Virus Screening  Your current symptoms could be consistent with the coronavirus.  Many health care providers can now test patients at their office but not all are.  Monica Harding has multiple testing sites. For information on our [place] testing locations and hours go to [email or web address]  We are enrolling you in our [place] for [place] . Daily you will receive a questionnaire within the [facility] website. Our COVID 19 response team will be monitoring your responses daily.  Testing Information: The COVID-19 Community Testing sites will begin testing BY APPOINTMENT ONLY.  You can schedule online at [email or web address]  If you do not have access to a smart phone or computer you may call [phone] for an appointment.   Additional testing sites in the Community:  . For [facility] Testing sites in [facility]  FaceUpdate.uy  . For Pop-up testing sites in [place]  BowlDirectory.co.uk  . For Testing sites with regular hours https://onsms.org//  . For [place] [email or web address]  . For [facility] [email or web address]  . For [facility] testing in [place] and [employer] [email or web address]  . For [facility] testing in [facility]   [email or web address].[email or web address]  For  more information about community testing call [phone]   Please quarantine yourself while awaiting your test results. Please stay home for a minimum of 10 days from the first day of illness with improving symptoms and you have had 24 hours of no fever (without the use of Tylenol (Acetaminophen)  Motrin (Ibuprofen) or any fever reducing medication).  Also - Do not get tested prior to returning to work because once you have had a positive test the test can stay positive for more then a month in some cases.   You should wear a mask or cloth face covering over your nose and mouth if you must be around other people or animals, including pets (even at home). Try to stay at least 6 feet away from other people. This will protect the people around you.  Please continue good preventive care measures, including:  frequent hand-washing, avoid touching your face, cover coughs/sneezes, stay out of crowds and keep a 6 foot distance from others.  COVID-19 is a respiratory illness with symptoms that are similar to the flu. Symptoms are typically mild to moderate, but there have been cases of severe illness and death due to the virus.   The following symptoms may appear 2-14 days after exposure: . Fever . Cough . Shortness of breath or difficulty breathing . Chills . Repeated shaking with chills . Muscle pain . Headache . Sore throat . New loss of taste or smell . Fatigue . Congestion or runny nose . Nausea or vomiting . Diarrhea  Go to the nearest hospital ED for assessment if fever/cough/breathlessness are severe or illness seems like a threat to life.  It is vitally important that if you feel that you have an infection such as this virus or any other virus that you stay home and away from places where you may spread it to others.  You should avoid contact with people age 42 and older.   You can use medication such as A prescription cough medication called Phenergan DM 6.25 mg/15 mg. You make take one teaspoon / 5 ml every 4-6 hours as needed for cough  You may also take acetaminophen (Tylenol) as needed for fever.  Reduce your risk of any infection by using the same precautions used for avoiding the common cold or flu:  .  Wash your hands often with soap and warm water for at least 20 seconds.  If  soap and water are not readily available, use an alcohol-based hand sanitizer with at least 60% alcohol.  . If coughing or sneezing, cover your mouth and nose by coughing or sneezing into the elbow areas of your shirt or coat, into a tissue or into your sleeve (not your hands). . Avoid shaking hands with others and consider head nods or verbal greetings only. . Avoid touching your eyes, nose, or mouth with unwashed hands.  . Avoid close contact with people who are sick. . Avoid places or events with large numbers of people in one location, like concerts or sporting events. . Carefully consider travel plans you have or are making. . If you are planning any travel outside or inside the [place], visit the CDC's Travelers' Health webpage for the latest health notices. . If you have some symptoms but not all symptoms, continue to monitor at home and seek medical attention if your symptoms worsen. . If you are having a medical emergency, call 911.  HOME CARE . Only take medications as instructed by your medical team. . Drink plenty of fluids and get plenty of rest. . A steam or ultrasonic humidifier can help if you have congestion.   GET HELP RIGHT AWAY IF YOU HAVE EMERGENCY WARNING SIGNS** FOR COVID-19. If you or someone is showing any of these signs seek emergency medical care immediately. Call 911 or proceed to your closest emergency facility if: . You develop worsening high fever. . Trouble breathing . Bluish lips or face . Persistent pain or pressure in the chest . New confusion . Inability to wake or stay awake . You cough up blood. . Your symptoms become more severe  **This list is not all possible symptoms. Contact your medical provider for any symptoms that are sever or concerning to you.  MAKE SURE YOU   Understand these instructions.  Will watch your condition.  Will get help right away if you are not doing well or get worse.  Your e-visit answers were reviewed by a board  certified advanced clinical practitioner to complete your personal care plan.  Depending on the condition, your plan could have included both over the counter or prescription medications.  If there is a problem please reply once you have received a response from your provider.  Your safety is important to [place].  If you have drug allergies check your prescription carefully.    You can use MyChart to ask questions about today's visit, request a non-urgent call back, or ask for a work or school excuse for 24 hours related to this e-Visit. If it has been greater than 24 hours you will need to follow up with your provider, or enter a new e-Visit to address those concerns. You will get an e-mail in the next two days asking about your experience.  I hope that your e-visit has been valuable and will speed your recovery. Thank you for using e-visits.  I have spent at least 5 minutes reviewing and documenting in the patient's chart.

## 2019-05-08 ENCOUNTER — Ambulatory Visit: Payer: 59 | Attending: Internal Medicine

## 2019-05-08 DIAGNOSIS — Z20822 Contact with and (suspected) exposure to covid-19: Secondary | ICD-10-CM

## 2019-05-08 DIAGNOSIS — U071 COVID-19: Secondary | ICD-10-CM | POA: Insufficient documentation

## 2019-05-09 LAB — NOVEL CORONAVIRUS, NAA: SARS-CoV-2, NAA: DETECTED — AB

## 2019-05-10 ENCOUNTER — Telehealth: Payer: Self-pay | Admitting: Adult Health

## 2019-05-10 ENCOUNTER — Telehealth: Payer: Self-pay | Admitting: Nurse Practitioner

## 2019-05-10 NOTE — Telephone Encounter (Signed)
Called toDiscuss with patient about recent SARS-CoV-2 + test,COVID symptoms and the use of bamlanivimab, a monoclonal antibody infusion for those with mild to moderate Covid symptoms and at a high risk of hospitalization.   Unable to reach pt- voicemail left on mobile number. Home number disconnected.  William Hamburger NP-C

## 2019-05-10 NOTE — Telephone Encounter (Signed)
Called to Discuss with patient about Covid symptoms and the use of bamlanivimab, a monoclonal antibody infusion for those with mild to moderate Covid symptoms and at a high risk of hospitalization.      Unable to reach pt

## 2019-05-10 NOTE — Telephone Encounter (Signed)
Called toDiscuss with patient about Covid symptoms and the use of bamlanivimab, a monoclonal antibody infusion for those with mild to moderate Covid symptoms and at a high risk of hospitalization.    Unable to reach pt -  Lorelle Macaluso NP-C

## 2019-06-28 ENCOUNTER — Ambulatory Visit (INDEPENDENT_AMBULATORY_CARE_PROVIDER_SITE_OTHER)
Admission: RE | Admit: 2019-06-28 | Discharge: 2019-06-28 | Disposition: A | Discharge status: Home / Self Care | Payer: 59 | Source: Ambulatory Visit

## 2019-06-28 DIAGNOSIS — R198 Other specified symptoms and signs involving the digestive system and abdomen: Secondary | ICD-10-CM | POA: Diagnosis not present

## 2019-06-28 NOTE — ED Provider Notes (Addendum)
Virtual Visit via Video Note:  Monica Harding  initiated request for Telemedicine visit with [facility] team. I connected with Monica Harding  on 06/28/2019 at 3:09 PM  for a synchronized telemedicine visit using a video enabled HIPPA compliant telemedicine application. I verified that I am speaking with Monica Harding  using two identifiers. Jaynee Eagles, PA-C  was physically located in a [facility] Urgent care site and Monica Harding was located at a different location.   The limitations of evaluation and management by telemedicine as well as the availability of in-person appointments were discussed. Patient was informed that she  may incur a bill ( including co-pay) for this virtual visit encounter. Monica Harding  expressed understanding and gave verbal consent to proceed with virtual visit.     History of Present Illness:Monica Harding  is a 29 y.o. female presents with several day history of black discharge that is malodorous from the umbilicus.  Patient cleans out her bellybutton almost daily.  Denies fever, nausea, vomiting, abdominal pain, erythema, swelling.  She has a history of 2 C-sections.  ROS  No current facility-administered medications for this encounter.   Current Outpatient Medications  Medication Sig Dispense Refill  . acetaminophen (TYLENOL) 500 MG tablet Take 1,000 mg by mouth daily as needed for moderate pain or headache.     . benzonatate (TESSALON) 200 MG capsule Take 1 capsule (200 mg total) by mouth 2 (two) times daily as needed for cough. 20 capsule 0  . NIFEdipine (PROCARDIA-XL/ADALAT-CC/NIFEDICAL-XL) 30 MG 24 hr tablet Take 1 tablet (30 mg total) by mouth daily. 30 tablet 1  . Prenatal Multivit-Min-Fe-FA (PRENATAL VITAMINS) 0.8 MG tablet Take 1 tablet by mouth daily. 30 tablet 12  . promethazine-dextromethorphan (PROMETHAZINE-DM) 6.25-15 MG/5ML syrup Take 5 mLs by mouth 4 (four) times daily as needed for cough. 118 mL 0     No  Known Allergies   Past Medical History:  Diagnosis Date  . Chlamydia   . Hypertension   . Vaginal Pap smear, abnormal     Past Surgical History:  Procedure Laterality Date  . CESAREAN SECTION N/A 12/09/2013   Procedure: CESAREAN SECTION;  Surgeon: Guss Bunde, MD;  Location: [place] [facility];  Service: Obstetrics;  Laterality: N/A;  . CESAREAN SECTION WITH BILATERAL TUBAL LIGATION Bilateral 07/23/2017   Procedure: REPEAT CESAREAN SECTION WITH BILATERAL TUBAL LIGATION;  Surgeon: Donnamae Jude, MD;  Location: [facility];  Service: Obstetrics;  Laterality: Bilateral;      Observations/Objective: Physical Exam Constitutional:      General: She is not in acute distress.    Appearance: Normal appearance. She is well-developed. She is not ill-appearing, toxic-appearing or diaphoretic.  Eyes:     Extraocular Movements: Extraocular movements intact.  Pulmonary:     Effort: Pulmonary effort is normal.  Neurological:     General: No focal deficit present.     Mental Status: She is alert and oriented to person, place, and time.  Psychiatric:        Mood and Affect: Mood normal.        Behavior: Behavior normal.        Thought Content: Thought content normal.        Judgment: Judgment normal.      Assessment and Plan:  1. Umbilicus discharge    Counseled patient that she would benefit from a consult with general surgery to rule out umbilical hernia.  It is also possible that she  has a general fungal infection of the umbilicus.  Low suspicion for bacterial infection but recommend supportive care and in person office visit if sx persist - that way she can be evaluated for either antifungal or antibiotic treatment to address this as a possible source of her umbilicus drainage. Counseled patient on potential for adverse effects with medications prescribed/recommended today, ER and return-to-clinic precautions discussed, patient verbalized understanding.    Follow Up Instructions:     I discussed the assessment and treatment plan with the patient. The patient was provided an opportunity to ask questions and all were answered. The patient agreed with the plan and demonstrated an understanding of the instructions.   The patient was advised to call back or seek an in-person evaluation if the symptoms worsen or if the condition fails to improve as anticipated.  I provided 10 minutes of non-face-to-face time during this encounter.    Wallis Bamberg, PA-C  06/28/2019 3:09 PM       Wallis Bamberg, PA-C 06/28/19 1548

## 2019-06-28 NOTE — Discharge Instructions (Signed)
If you develop fever, redness, abdominal pain, swelling then these are signs of infection and should be evaluated at an urgent care or the emergency if the pain is severe (not responding to ibuprofen or Tylenol).

## 2019-07-01 ENCOUNTER — Ambulatory Visit (HOSPITAL_COMMUNITY)
Admission: EM | Admit: 2019-07-01 | Discharge: 2019-07-01 | Disposition: A | Discharge status: Home / Self Care | Payer: 59 | Attending: Urgent Care | Admitting: Urgent Care

## 2019-07-01 ENCOUNTER — Other Ambulatory Visit: Payer: Self-pay

## 2019-07-01 ENCOUNTER — Encounter (HOSPITAL_COMMUNITY): Payer: Self-pay | Admitting: *Deleted

## 2019-07-01 DIAGNOSIS — J020 Streptococcal pharyngitis: Secondary | ICD-10-CM

## 2019-07-01 DIAGNOSIS — R198 Other specified symptoms and signs involving the digestive system and abdomen: Secondary | ICD-10-CM

## 2019-07-01 DIAGNOSIS — J029 Acute pharyngitis, unspecified: Secondary | ICD-10-CM

## 2019-07-01 DIAGNOSIS — L299 Pruritus, unspecified: Secondary | ICD-10-CM

## 2019-07-01 HISTORY — DX: COVID-19: U07.1

## 2019-07-01 LAB — POCT RAPID STREP A: Streptococcus, Group A Screen (Direct): POSITIVE — AB

## 2019-07-01 MED ORDER — AMOXICILLIN 500 MG PO CAPS: 500.0000 mg | ORAL_CAPSULE | Freq: Two times a day (BID) | ORAL | 0 refills | Status: AC

## 2019-07-01 MED ORDER — FLUCONAZOLE 150 MG PO TABS: 150.0000 mg | ORAL_TABLET | ORAL | 0 refills | Status: AC

## 2019-07-01 NOTE — ED Provider Notes (Signed)
[facility]   MRN: [id number] DOB: Jul 13, 1990  Subjective:   Monica Harding is a 29 y.o. female presenting for 4-day history of acute onset persistent mild to moderate throat pain with difficulty swallowing.  Patient also had a video visit with me about 4 days ago for drainage of the umbilicus.  The drainage has stopped but she continues to have skin change and pruritus.  Would like treatment for this.  No current facility-administered medications for this encounter.  Current Outpatient Medications:  .  acetaminophen (TYLENOL) 500 MG tablet, Take 1,000 mg by mouth daily as needed for moderate pain or headache. , Disp: , Rfl:  .  benzonatate (TESSALON) 200 MG capsule, Take 1 capsule (200 mg total) by mouth 2 (two) times daily as needed for cough., Disp: 20 capsule, Rfl: 0 .  NIFEdipine (PROCARDIA-XL/ADALAT-CC/NIFEDICAL-XL) 30 MG 24 hr tablet, Take 1 tablet (30 mg total) by mouth daily., Disp: 30 tablet, Rfl: 1 .  Prenatal Multivit-Min-Fe-FA (PRENATAL VITAMINS) 0.8 MG tablet, Take 1 tablet by mouth daily., Disp: 30 tablet, Rfl: 12 .  promethazine-dextromethorphan (PROMETHAZINE-DM) 6.25-15 MG/5ML syrup, Take 5 mLs by mouth 4 (four) times daily as needed for cough., Disp: 118 mL, Rfl: 0   No Known Allergies  Past Medical History:  Diagnosis Date  . Chlamydia   . COVID-19   . Hypertension   . Vaginal Pap smear, abnormal      Past Surgical History:  Procedure Laterality Date  . CESAREAN SECTION N/A 12/09/2013   Procedure: CESAREAN SECTION;  Surgeon: Lesly Dukes, MD;  Location: [facility];  Service: Obstetrics;  Laterality: N/A;  . CESAREAN SECTION WITH BILATERAL TUBAL LIGATION Bilateral 07/23/2017   Procedure: REPEAT CESAREAN SECTION WITH BILATERAL TUBAL LIGATION;  Surgeon: Reva Bores, MD;  Location: [facility];  Service: Obstetrics;  Laterality: Bilateral;    Family History  Problem Relation Age of Onset  . Hypertension Mother   . Hypertension Father   .  Cancer Maternal Grandmother   . Diabetes Maternal Grandmother   . Cancer Maternal Grandfather   . Diabetes Paternal Grandmother   . Cancer Paternal Grandfather     Social History   Tobacco Use  . Smoking status: Never Smoker  . Smokeless tobacco: Never Used  Substance Use Topics  . Alcohol use: Not Currently  . Drug use: Never    Review of Systems  Constitutional: Negative for fever and malaise/fatigue.  HENT: Positive for sore throat. Negative for congestion, ear pain and sinus pain.   Eyes: Negative for discharge and redness.  Respiratory: Negative for cough, hemoptysis, shortness of breath and wheezing.   Cardiovascular: Negative for chest pain.  Gastrointestinal: Negative for abdominal pain, diarrhea, nausea and vomiting.  Genitourinary: Negative for dysuria, flank pain and hematuria.  Musculoskeletal: Negative for myalgias.  Skin: Negative for rash.  Neurological: Negative for dizziness, weakness and headaches.  Psychiatric/Behavioral: Negative for depression and substance abuse.     Objective:   Vitals: BP (!) 142/87   Pulse (!) 107   Temp 98.5 F (36.9 C) (Oral)   Resp 18   LMP 06/30/2019 (Exact Date)   SpO2 100%   Breastfeeding No   Physical Exam Constitutional:      General: She is not in acute distress.    Appearance: Normal appearance. She is well-developed. She is obese. She is not ill-appearing, toxic-appearing or diaphoretic.  HENT:     Head: Normocephalic and atraumatic.     Nose: Nose normal.  Mouth/Throat:     Mouth: Mucous membranes are moist.     Pharynx: Pharyngeal swelling, oropharyngeal exudate and posterior oropharyngeal erythema present. No uvula swelling.     Tonsils: Tonsillar exudate present. No tonsillar abscesses. 1+ on the right. 1+ on the left.  Eyes:     General: No scleral icterus.    Extraocular Movements: Extraocular movements intact.     Pupils: Pupils are equal, round, and reactive to light.  Cardiovascular:     Rate  and Rhythm: Normal rate.  Pulmonary:     Effort: Pulmonary effort is normal.  Abdominal:    Skin:    General: Skin is warm and dry.  Neurological:     General: No focal deficit present.     Mental Status: She is alert and oriented to person, place, and time.  Psychiatric:        Mood and Affect: Mood normal.        Behavior: Behavior normal.     Results for orders placed or performed during the hospital encounter of 07/01/19 (from the past 24 hour(s))  POCT rapid strep A [facility])     Status: Abnormal   Collection Time: 07/01/19 12:49 PM  Result Value Ref Range   Streptococcus, Group A Screen (Direct) POSITIVE (A) NEGATIVE    Assessment and Plan :   1. Pharyngitis due to Streptococcus species   2. Sore throat   3. Umbilicus discharge   4. Itching     Start amoxicillin to address strep throat.  Use Diflucan to address fungal infection of the umbilicus. Hold off on consult with general surgery as there is much lower suspicion for umbilical hernia given her in person physical exam findings. Use supportive care otherwise for throat pain. Counseled patient on potential for adverse effects with medications prescribed/recommended today, ER and return-to-clinic precautions discussed, patient verbalized understanding.    Jaynee Eagles, PA-C 07/01/19 1258

## 2019-07-01 NOTE — ED Triage Notes (Signed)
Reports starting with tender throat - "feels like strep" 4 days ago without fever.  Also c/o starting with umbilical drainage and discomfort onset 4 days ago; states had video visit and was told to come to [facility].  States umbilicus is no longer painful or draining, but now with pruritis.

## 2019-07-01 NOTE — Discharge Instructions (Addendum)
You have strep throat and need to start amoxicillin to address this.  Please make sure you take this twice daily.  Start Diflucan to address fungal infection of your belly button.
